# Patient Record
Sex: Male | Born: 1995 | Race: White | Hispanic: No | Marital: Single | Smoking: Current every day smoker
Health system: Southern US, Community
[De-identification: ages and names within clinical notes are randomized; demographics above are authoritative.]

## PROBLEM LIST (undated history)

## (undated) DIAGNOSIS — I10 Essential (primary) hypertension: Secondary | ICD-10-CM

---
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
## 2017-03-05 ENCOUNTER — Ambulatory Visit (INDEPENDENT_AMBULATORY_CARE_PROVIDER_SITE_OTHER): Payer: Medicaid Other | Admitting: Pediatrics

## 2017-03-05 ENCOUNTER — Encounter: Payer: Self-pay | Admitting: Pediatrics

## 2017-03-05 VITALS — BP 144/81 | HR 103 | Temp 98.0°F | Ht 72.08 in | Wt 216.2 lb

## 2017-03-05 DIAGNOSIS — I10 Essential (primary) hypertension: Secondary | ICD-10-CM

## 2017-03-05 LAB — URINALYSIS, COMPLETE
Bilirubin, UA: NEGATIVE
Glucose, UA: NEGATIVE
Ketones, UA: NEGATIVE
Leukocytes, UA: NEGATIVE
NITRITE UA: NEGATIVE
PH UA: 5.5 (ref 5.0–7.5)
Protein, UA: NEGATIVE
Specific Gravity, UA: 1.015 (ref 1.005–1.030)
UUROB: 0.2 mg/dL (ref 0.2–1.0)

## 2017-03-05 LAB — MICROSCOPIC EXAMINATION
BACTERIA UA: NONE SEEN
Epithelial Cells (non renal): NONE SEEN /hpf (ref 0–10)
RENAL EPITHEL UA: NONE SEEN /HPF

## 2017-03-05 MED ORDER — LISINOPRIL 10 MG PO TABS: 10.0000 mg | ORAL_TABLET | Freq: Every day | ORAL | 3 refills | Status: DC

## 2017-03-05 NOTE — Progress Notes (Signed)
Subjective:   Patient ID: Eric Trevino, male    DOB: Oct 16, 1995, 21 y.o.   MRN: [id number] CC: New Patient (Initial Visit) and Hypertension  HPI: Eric Trevino is a 21 y.o. male presenting for New Patient (Initial Visit) and Hypertension  Here today with his girlfriend Has a new baby at home  Says his BP has been high anytime he is in the ED, rarely seen doctor   Says he was not regularly seeing a pediatrician growing up Every time he was seen in the emergency room has an elevated blood pressure For the last few months he has had headaches about once a week Hurt all over his head Gets better when he is in a dark room Has had a couple episodes of feeling like his heart is pounding  Bothered by knee pain and back pain as well  Brought blood pressure readings from the last week with him Range 160F-093A systolic, 35T-73U diastolic in both arms Lowest reading 158/87  PMH: No history of heart problems or lung problems  His mother into his brothers have a genetic vision problem, they are legally blind   Family History  Problem Relation Age of Onset  . Vision loss Mother   . Vision loss Brother    Social History   Socioeconomic History  . Marital status: Significant Other    Spouse name: None  . Number of children: None  . Years of education: None  . Highest education level: None  Social Needs  . Financial resource strain: None  . Food insecurity - worry: None  . Food insecurity - inability: None  . Transportation needs - medical: None  . Transportation needs - non-medical: None  Occupational History  . None  Tobacco Use  . Smoking status: Current Every Day Smoker    Packs/day: 0.50    Years: 3.00    Pack years: 1.50    Types: Cigarettes  . Smokeless tobacco: Never Used  Substance and Sexual Activity  . Alcohol use: No  . Drug use: No  . Sexual activity: Yes  Other Topics Concern  . None  Social History Narrative  . None   ROS: All systems negative  other than what is in HPI  Objective:    BP (!) 144/81   Pulse (!) 103   Temp 98 F (36.7 C) (Oral)   Ht 6' 0.08" (1.831 m)   Wt 216 lb 3.2 oz (98.1 kg)   BMI 29.26 kg/m   Wt Readings from Last 3 Encounters:  03/05/17 216 lb 3.2 oz (98.1 kg) (97 %, Z= 1.83)*  08/20/16 189 lb 3.2 oz (85.8 kg) (90 %, Z= 1.26)*  08/09/11 160 lb (72.6 kg) (97 %, Z= 1.85)*   * Growth percentiles are based on CDC (Boys, 2-20 Years) data.    Gen: NAD, alert, cooperative with exam, NCAT EYES: EOMI, no conjunctival injection, or no icterus ENT:  OP without erythema LYMPH: no cervical LAD CV: NRRR, normal S1/S2, no murmur, distal pulses 2+ b/l Resp: CTABL, no wheezes, normal WOB Abd: +BS, soft, NTND. no guarding or organomegaly Ext: No edema, warm Neuro: Alert and oriented, strength equal b/l UE and LE, coordination grossly normal MSK: No crepitus bilateral knees  Assessment & Plan:  Eric Trevino was seen today for new patient (initial visit) and hypertension.  Diagnoses and all orders for this visit:  Essential hypertension Check urine, labs Patient is young to have such elevated blood pressures, BMI 29 Not regularly followed by a primary  care doctor Start lisinopril -     Urinalysis, Complete -     CMP14+EGFR -     CBC with Differential/Platelet -     lisinopril (PRINIVIL,ZESTRIL) 10 MG tablet; Take 1 tablet (10 mg total) by mouth daily.   Follow up plan: Follow-up in 2 weeks [name], MD [place]

## 2017-03-06 LAB — CBC WITH DIFFERENTIAL/PLATELET
BASOS ABS: 0 10*3/uL (ref 0.0–0.2)
BASOS: 0 %
EOS (ABSOLUTE): 0.2 10*3/uL (ref 0.0–0.4)
EOS: 3 %
HEMATOCRIT: 45.2 % (ref 37.5–51.0)
HEMOGLOBIN: 15.2 g/dL (ref 13.0–17.7)
IMMATURE GRANS (ABS): 0 10*3/uL (ref 0.0–0.1)
Immature Granulocytes: 0 %
LYMPHS ABS: 2 10*3/uL (ref 0.7–3.1)
LYMPHS: 29 %
MCH: 29.1 pg (ref 26.6–33.0)
MCHC: 33.6 g/dL (ref 31.5–35.7)
MCV: 86 fL (ref 79–97)
MONOCYTES: 12 %
Monocytes Absolute: 0.9 10*3/uL (ref 0.1–0.9)
NEUTROS ABS: 4 10*3/uL (ref 1.4–7.0)
Neutrophils: 56 %
Platelets: 257 10*3/uL (ref 150–379)
RBC: 5.23 x10E6/uL (ref 4.14–5.80)
RDW: 13.8 % (ref 12.3–15.4)
WBC: 7.1 10*3/uL (ref 3.4–10.8)

## 2017-03-06 LAB — CMP14+EGFR
ALK PHOS: 76 IU/L (ref 39–117)
ALT: 22 IU/L (ref 0–44)
AST: 22 IU/L (ref 0–40)
Albumin/Globulin Ratio: 1.6 (ref 1.2–2.2)
Albumin: 4.8 g/dL (ref 3.5–5.5)
BUN/Creatinine Ratio: 12 (ref 9–20)
BUN: 10 mg/dL (ref 6–20)
Bilirubin Total: 0.2 mg/dL (ref 0.0–1.2)
CO2: 23 mmol/L (ref 20–29)
CREATININE: 0.82 mg/dL (ref 0.76–1.27)
Calcium: 10 mg/dL (ref 8.7–10.2)
Chloride: 101 mmol/L (ref 96–106)
GFR calc Af Amer: 148 mL/min/{1.73_m2} (ref >59)
GFR calc non Af Amer: 128 mL/min/{1.73_m2} (ref >59)
Globulin, Total: 3 g/dL (ref 1.5–4.5)
Glucose: 87 mg/dL (ref 65–99)
Potassium: 4.1 mmol/L (ref 3.5–5.2)
Sodium: 140 mmol/L (ref 134–144)
TOTAL PROTEIN: 7.8 g/dL (ref 6.0–8.5)

## 2017-03-19 ENCOUNTER — Ambulatory Visit (INDEPENDENT_AMBULATORY_CARE_PROVIDER_SITE_OTHER): Payer: Medicaid Other | Admitting: Pediatrics

## 2017-03-19 ENCOUNTER — Encounter: Payer: Self-pay | Admitting: Pediatrics

## 2017-03-19 VITALS — BP 136/77 | HR 85 | Temp 97.5°F | Ht 72.09 in | Wt 216.6 lb

## 2017-03-19 DIAGNOSIS — M546 Pain in thoracic spine: Secondary | ICD-10-CM

## 2017-03-19 DIAGNOSIS — G8929 Other chronic pain: Secondary | ICD-10-CM | POA: Diagnosis not present

## 2017-03-19 DIAGNOSIS — I10 Essential (primary) hypertension: Secondary | ICD-10-CM | POA: Diagnosis not present

## 2017-03-19 DIAGNOSIS — R319 Hematuria, unspecified: Secondary | ICD-10-CM

## 2017-03-19 LAB — URINALYSIS, COMPLETE
Bilirubin, UA: NEGATIVE
Glucose, UA: NEGATIVE
Ketones, UA: NEGATIVE
NITRITE UA: NEGATIVE
PH UA: 5.5 (ref 5.0–7.5)
Protein, UA: NEGATIVE
RBC, UA: NEGATIVE
Specific Gravity, UA: 1.025 (ref 1.005–1.030)
UUROB: 0.2 mg/dL (ref 0.2–1.0)

## 2017-03-19 LAB — MICROSCOPIC EXAMINATION
BACTERIA UA: NONE SEEN
RBC MICROSCOPIC, UA: NONE SEEN /HPF (ref 0–?)
RENAL EPITHEL UA: NONE SEEN /HPF

## 2017-03-19 MED ORDER — CYCLOBENZAPRINE HCL 5 MG PO TABS: 5.0000 mg | ORAL_TABLET | Freq: Two times a day (BID) | ORAL | 1 refills | Status: DC | PRN

## 2017-03-19 NOTE — Progress Notes (Signed)
  Subjective:   Patient ID: Eric Trevino, male    DOB: 06-17-1995, 21 y.o.   MRN: [id number] CC: Follow-up (2 week) Blood pressure follow-up HPI: Eric Trevino is a 21 y.o. male presenting for Follow-up (2 week)  Started on blood pressure medicine 2 weeks ago for ongoing elevated blood pressures Has been asymptomatic No chest pain, shortness of breath, headaches, vision changes, lightheadedness Did have trace hematuria in urinalysis 2 weeks ago  Now also complaining of back pain ongoing for the last few months Used to work lifting heavy things like bricks, now mostly doing landscaping Points to mid back left side, soft tissue with where pain is the worst Says it affects him mostly at night, sometimes feels like a knife stabbing in his back Getting up walking around helps with the pain Pain does not radiate anywhere else No history of kidney stones  Relevant past medical, surgical, family and social history reviewed. Allergies and medications reviewed and updated. Social History   Tobacco Use  Smoking Status Current Every Day Smoker  . Packs/day: 0.50  . Years: 3.00  . Pack years: 1.50  . Types: Cigarettes  Smokeless Tobacco Never Used   ROS: Per HPI   Objective:    BP 136/77   Pulse 85   Temp (!) 97.5 F (36.4 C) (Oral)   Ht 6' 0.09" (1.831 m)   Wt 216 lb 9.6 oz (98.2 kg)   BMI 29.30 kg/m   Wt Readings from Last 3 Encounters:  03/19/17 216 lb 9.6 oz (98.2 kg) (97 %, Z= 1.84)*  03/05/17 216 lb 3.2 oz (98.1 kg) (97 %, Z= 1.83)*  08/20/16 189 lb 3.2 oz (85.8 kg) (90 %, Z= 1.26)*   * Growth percentiles are based on CDC (Boys, 2-20 Years) data.    Blood pressure percentiles are 90 % systolic and 71 % diastolic based on the August 2017 AAP Clinical Practice Guideline. This reading is in the Stage 1 hypertension range (BP >= 130/80).  Gen: NAD, alert, cooperative with exam, NCAT EYES: EOMI, no conjunctival injection, or no icterus ENT:  OP without erythema LYMPH:  no cervical LAD CV: NRRR, normal S1/S2, no murmur, distal pulses 2+ b/l Resp: CTABL, no wheezes, normal WOB Abd: +BS, soft, NTND. no guarding or organomegaly, no CVA tenderness Ext: No edema, warm Neuro: Alert and oriented, strength equal b/l UE and LE, coordination grossly normal MSK: ttp L mid back paraspinal muscles Full ROM flexion, twisting back No crepitus b/l knees  Assessment & Plan:  [place] was seen today for follow-up HTN.  Diagnoses and all orders for this visit:  Essential hypertension Slightly elevated today Patient to check at home, continue current medicine We will recheck labs  Chronic left-sided thoracic back pain Bothers mostly at night after working ttp over paraspinal muscles Will do trial of gentle back exercises/stretching Trace hematuria as below, repeat -     cyclobenzaprine (FLEXERIL) 5 MG tablet; Take 1 tablet (5 mg total) by mouth 2 (two) times daily as needed for muscle spasms. -     BMP8+EGFR -     Urinalysis, Complete  Hematuria Repeat UA, did have trace hematuria  Follow up plan: Return in about 3 months (around 06/17/2017). [name], MD [place]

## 2017-03-19 NOTE — Patient Instructions (Signed)
Back Exercises If you have pain in your back, do these exercises 2-3 times each day or as told by your doctor. When the pain goes away, do the exercises once each day, but repeat the steps more times for each exercise (do more repetitions). If you do not have pain in your back, do these exercises once each day or as told by your doctor. Exercises Single Knee to Chest  Do these steps 3-5 times in a row for each leg: 1. Lie on your back on a firm bed or the floor with your legs stretched out. 2. Bring one knee to your chest. 3. Hold your knee to your chest by grabbing your knee or thigh. 4. Pull on your knee until you feel a gentle stretch in your lower back. 5. Keep doing the stretch for 10-30 seconds. 6. Slowly let go of your leg and straighten it.  Pelvic Tilt  Do these steps 5-10 times in a row: 1. Lie on your back on a firm bed or the floor with your legs stretched out. 2. Bend your knees so they point up to the ceiling. Your feet should be flat on the floor. 3. Tighten your lower belly (abdomen) muscles to press your lower back against the floor. This will make your tailbone point up to the ceiling instead of pointing down to your feet or the floor. 4. Stay in this position for 5-10 seconds while you gently tighten your muscles and breathe evenly.  Cat-Cow  Do these steps until your lower back bends more easily: 1. Get on your hands and knees on a firm surface. Keep your hands under your shoulders, and keep your knees under your hips. You may put padding under your knees. 2. Let your head hang down, and make your tailbone point down to the floor so your lower back is round like the back of a cat. 3. Stay in this position for 5 seconds. 4. Slowly lift your head and make your tailbone point up to the ceiling so your back hangs low (sags) like the back of a cow. 5. Stay in this position for 5 seconds.  Press-Ups  Do these steps 5-10 times in a row: 1. Lie on your belly (face-down)  on the floor. 2. Place your hands near your head, about shoulder-width apart. 3. While you keep your back relaxed and keep your hips on the floor, slowly straighten your arms to raise the top half of your body and lift your shoulders. Do not use your back muscles. To make yourself more comfortable, you may change where you place your hands. 4. Stay in this position for 5 seconds. 5. Slowly return to lying flat on the floor.  Bridges  Do these steps 10 times in a row: 1. Lie on your back on a firm surface. 2. Bend your knees so they point up to the ceiling. Your feet should be flat on the floor. 3. Tighten your butt muscles and lift your butt off of the floor until your waist is almost as high as your knees. If you do not feel the muscles working in your butt and the back of your thighs, slide your feet 1-2 inches farther away from your butt. 4. Stay in this position for 3-5 seconds. 5. Slowly lower your butt to the floor, and let your butt muscles relax.  If this exercise is too easy, try doing it with your arms crossed over your chest. Back Lifts Do these steps 5-10 times in a   row: 1. Lie on your belly (face-down) with your arms at your sides, and rest your forehead on the floor. 2. Tighten the muscles in your legs and your butt. 3. Slowly lift your chest off of the floor while you keep your hips on the floor. Keep the back of your head in line with the curve in your back. Look at the floor while you do this. 4. Stay in this position for 3-5 seconds. 5. Slowly lower your chest and your face to the floor.  Contact a doctor if:  Your back pain gets a lot worse when you do an exercise.  Your back pain does not lessen 2 hours after you exercise. If you have any of these problems, stop doing the exercises. Do not do them again unless your doctor says it is okay. Get help right away if:  You have sudden, very bad back pain. If this happens, stop doing the exercises. Do not do them again  unless your doctor says it is okay. This information is not intended to replace advice given to you by your health care provider. Make sure you discuss any questions you have with your health care provider. Document Released: 04/22/2010 Document Revised: 08/26/2015 Document Reviewed: 05/14/2014 Elsevier Interactive Patient Education  2018 [employer] Inc[employer]   

## 2017-03-20 LAB — BMP8+EGFR
BUN / CREAT RATIO: 10 (ref 9–20)
BUN: 9 mg/dL (ref 6–20)
CALCIUM: 9.7 mg/dL (ref 8.7–10.2)
CO2: 27 mmol/L (ref 20–29)
Chloride: 100 mmol/L (ref 96–106)
Creatinine, Ser: 0.88 mg/dL (ref 0.76–1.27)
GFR, EST AFRICAN AMERICAN: 144 mL/min/{1.73_m2} (ref >59)
GFR, EST NON AFRICAN AMERICAN: 125 mL/min/{1.73_m2} (ref >59)
Glucose: 81 mg/dL (ref 65–99)
POTASSIUM: 4.3 mmol/L (ref 3.5–5.2)
Sodium: 139 mmol/L (ref 134–144)

## 2017-04-11 ENCOUNTER — Ambulatory Visit (INDEPENDENT_AMBULATORY_CARE_PROVIDER_SITE_OTHER): Payer: Medicaid Other

## 2017-04-11 ENCOUNTER — Ambulatory Visit (INDEPENDENT_AMBULATORY_CARE_PROVIDER_SITE_OTHER): Payer: Medicaid Other | Admitting: Pediatrics

## 2017-04-11 ENCOUNTER — Encounter: Payer: Self-pay | Admitting: Pediatrics

## 2017-04-11 VITALS — BP 129/77 | HR 87 | Temp 97.6°F | Ht 72.1 in | Wt 213.0 lb

## 2017-04-11 DIAGNOSIS — M25562 Pain in left knee: Secondary | ICD-10-CM | POA: Diagnosis not present

## 2017-04-11 DIAGNOSIS — M546 Pain in thoracic spine: Secondary | ICD-10-CM | POA: Diagnosis not present

## 2017-04-11 DIAGNOSIS — M25572 Pain in left ankle and joints of left foot: Secondary | ICD-10-CM

## 2017-04-11 DIAGNOSIS — M25561 Pain in right knee: Secondary | ICD-10-CM

## 2017-04-11 DIAGNOSIS — G8929 Other chronic pain: Secondary | ICD-10-CM | POA: Diagnosis not present

## 2017-04-11 NOTE — Patient Instructions (Signed)
Take 600 mg of ibuprofen every 8 hours as needed for pain  Okay to take 10 mg of Flexeril at night to help with back spasms   Back Exercises If you have pain in your back, do these exercises 2-3 times each day or as told by your doctor. When the pain goes away, do the exercises once each day, but repeat the steps more times for each exercise (do more repetitions). If you do not have pain in your back, do these exercises once each day or as told by your doctor. Exercises Single Knee to Chest  Do these steps 3-5 times in a row for each leg: 1. Lie on your back on a firm bed or the floor with your legs stretched out. 2. Bring one knee to your chest. 3. Hold your knee to your chest by grabbing your knee or thigh. 4. Pull on your knee until you feel a gentle stretch in your lower back. 5. Keep doing the stretch for 10-30 seconds. 6. Slowly let go of your leg and straighten it.  Pelvic Tilt  Do these steps 5-10 times in a row: 1. Lie on your back on a firm bed or the floor with your legs stretched out. 2. Bend your knees so they point up to the ceiling. Your feet should be flat on the floor. 3. Tighten your lower belly (abdomen) muscles to press your lower back against the floor. This will make your tailbone point up to the ceiling instead of pointing down to your feet or the floor. 4. Stay in this position for 5-10 seconds while you gently tighten your muscles and breathe evenly.  Cat-Cow  Do these steps until your lower back bends more easily: 1. Get on your hands and knees on a firm surface. Keep your hands under your shoulders, and keep your knees under your hips. You may put padding under your knees. 2. Let your head hang down, and make your tailbone point down to the floor so your lower back is round like the back of a cat. 3. Stay in this position for 5 seconds. 4. Slowly lift your head and make your tailbone point up to the ceiling so your back hangs low (sags) like the back of a  cow. 5. Stay in this position for 5 seconds.  Press-Ups  Do these steps 5-10 times in a row: 1. Lie on your belly (face-down) on the floor. 2. Place your hands near your head, about shoulder-width apart. 3. While you keep your back relaxed and keep your hips on the floor, slowly straighten your arms to raise the top half of your body and lift your shoulders. Do not use your back muscles. To make yourself more comfortable, you may change where you place your hands. 4. Stay in this position for 5 seconds. 5. Slowly return to lying flat on the floor.  Bridges  Do these steps 10 times in a row: 1. Lie on your back on a firm surface. 2. Bend your knees so they point up to the ceiling. Your feet should be flat on the floor. 3. Tighten your butt muscles and lift your butt off of the floor until your waist is almost as high as your knees. If you do not feel the muscles working in your butt and the back of your thighs, slide your feet 1-2 inches farther away from your butt. 4. Stay in this position for 3-5 seconds. 5. Slowly lower your butt to the floor, and let your  butt muscles relax.  If this exercise is too easy, try doing it with your arms crossed over your chest. Back Lifts Do these steps 5-10 times in a row: 1. Lie on your belly (face-down) with your arms at your sides, and rest your forehead on the floor. 2. Tighten the muscles in your legs and your butt. 3. Slowly lift your chest off of the floor while you keep your hips on the floor. Keep the back of your head in line with the curve in your back. Look at the floor while you do this. 4. Stay in this position for 3-5 seconds. 5. Slowly lower your chest and your face to the floor.  Contact a doctor if:  Your back pain gets a lot worse when you do an exercise.  Your back pain does not lessen 2 hours after you exercise. If you have any of these problems, stop doing the exercises. Do not do them again unless your doctor says it is  okay. Get help right away if:  You have sudden, very bad back pain. If this happens, stop doing the exercises. Do not do them again unless your doctor says it is okay. This information is not intended to replace advice given to you by your health care provider. Make sure you discuss any questions you have with your health care provider. Document Released: 04/22/2010 Document Revised: 08/26/2015 Document Reviewed: 05/14/2014 Elsevier Interactive Patient Education  [employer]2018 [employer] Inc[employer]

## 2017-04-11 NOTE — Progress Notes (Signed)
  Subjective:   Patient ID: Eric Trevino, male    DOB: 04-13-1995, 22 y.o.   MRN: [id number] CC: Back Pain (Mid, left); Leg Pain (Bilateral); and Foot Pain (Bilateral)  HPI: Eric Trevino is a 22 y.o. male presenting for Back Pain (Mid, left); Leg Pain (Bilateral); and Foot Pain (Bilateral)  Works in Glass blower/designerlandscaping Broke his left foot several years ago Since then has had left-sided foot pain when he is on his feet for long periods of time Wearing old work boots with minimal support  Pain is been ongoing for fracture, worse at the end of the day  He has had back pain for about the last year Points to mid left side soft tissue from where the pain is the worst Lifting heavy things makes pain worse  He says at night both of his knees bother him, he feels shooting pains from his knees up into his legs at times  Was started on Flexeril last visit for his back pain, 5 mg.  Has not been helping. Not taking NSAIDs  Relevant past medical, surgical, family and social history reviewed. Allergies and medications reviewed and updated. Social History   Tobacco Use  Smoking Status Current Every Day Smoker  . Packs/day: 0.50  . Years: 3.00  . Pack years: 1.50  . Types: Cigarettes  Smokeless Tobacco Never Used   ROS: Per HPI   Objective:    BP 129/77   Pulse 87   Temp 97.6 F (36.4 C) (Oral)   Ht 6' 0.1" (1.831 m)   Wt 213 lb (96.6 kg)   BMI 28.81 kg/m   Wt Readings from Last 3 Encounters:  04/11/17 213 lb (96.6 kg) (96 %, Z= 1.76)*  03/19/17 216 lb 9.6 oz (98.2 kg) (97 %, Z= 1.84)*  03/05/17 216 lb 3.2 oz (98.1 kg) (97 %, Z= 1.83)*   * Growth percentiles are based on CDC (Boys, 2-20 Years) data.    Gen: NAD, alert, cooperative with exam, NCAT EYES: EOMI, no conjunctival injection, or no icterus CV:  distal pulses 2+ b/l Resp normal WOB Ext: No edema, warm Neuro: Alert and oriented,  coordination grossly normal MSK: No point tenderness over spine, tender to palpation  left-sided paraspinal muscles mid back Knees normal to inspection bilaterally, no effusion, no redness Minimal medial joint line bilaterally with palpation Normal range of motion bilateral knees, 180 degrees to at least 45 degrees bilaterally No crepitus freely moving patella, no pain Left foot with bony prominence below lateral malleolus No redness, swelling Mildly tender to palpation Nl ROM b/l ankles   Assessment & Plan:  Eric Trevino was seen today for back pain, leg pain and foot pain.  Diagnoses and all orders for this visit:  Chronic pain of left ankle History of fracture now with bony prominence We will get x-ray -     Ambulatory referral to Sports Medicine -     DG Foot Complete Left; Future  Chronic left-sided thoracic back pain Flexeril 10 mg nightly as needed NSAIDs, rest, gentle back exercises given -     Ambulatory referral to Sports Medicine  Chronic pain of both knees -     Ambulatory referral to Sports Medicine   Follow up plan: 3 months, sooner if needed Rex Krasarol Vincent, MD [facility]

## 2017-04-18 ENCOUNTER — Ambulatory Visit
Admission: RE | Admit: 2017-04-18 | Discharge: 2017-04-18 | Disposition: A | Discharge status: Home / Self Care | Payer: Medicaid Other | Source: Ambulatory Visit | Attending: Family Medicine | Admitting: Family Medicine

## 2017-04-18 ENCOUNTER — Ambulatory Visit (INDEPENDENT_AMBULATORY_CARE_PROVIDER_SITE_OTHER): Payer: Medicaid Other | Admitting: Family Medicine

## 2017-04-18 ENCOUNTER — Encounter: Payer: Self-pay | Admitting: Family Medicine

## 2017-04-18 ENCOUNTER — Other Ambulatory Visit: Payer: Self-pay | Admitting: Family Medicine

## 2017-04-18 ENCOUNTER — Telehealth: Payer: Self-pay | Admitting: Family Medicine

## 2017-04-18 VITALS — BP 141/71 | Ht 72.0 in | Wt 216.0 lb

## 2017-04-18 DIAGNOSIS — M25561 Pain in right knee: Secondary | ICD-10-CM | POA: Diagnosis not present

## 2017-04-18 DIAGNOSIS — G8929 Other chronic pain: Secondary | ICD-10-CM | POA: Diagnosis not present

## 2017-04-18 DIAGNOSIS — M546 Pain in thoracic spine: Secondary | ICD-10-CM

## 2017-04-18 DIAGNOSIS — M25562 Pain in left knee: Secondary | ICD-10-CM

## 2017-04-18 MED ORDER — MELOXICAM 15 MG PO TABS: 15.0000 mg | ORAL_TABLET | Freq: Every day | ORAL | 1 refills | Status: DC

## 2017-04-18 NOTE — Progress Notes (Addendum)
Chief complaint: Thoracic back pain 18 months, left knee pain 6 months, right knee pain 3 months  History of present illness: Eric Trevino is a 22 year old male who presents to the sports medicine office today with a few different issues today. He was kindly referred here by his primary physician for further evaluation. First off, he reports of having midthoracic back pain. He points along the paraspinal thoracic musculature as to where he feels the pain. He reports that symptoms have been present for approximately 18 months. He does not report of any specific inciting incident, trauma, or injury to explain the pain. He reports pain does radiate up and down the paraspinal musculature of the back, does not radiate down the arms or the legs. He describes the pain as a throbbing and aching pain. He reports back flexion and back extension causes pain. He does have a 51-month-old infant at home, reports that when he picks and raises him up he feels the pain in his back. He does not report of any previous back injury. He does not recall having history of scoliosis or unusual curvature of the spine. He does not report of any bowel or bladder incontinence, does not report of any saddle anesthesia. He has chatted with his primary physician about this previously. 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 He notices that his back pain is more bothersome at the end of the day. He was started on Flexeril, does not report of any interval improvement in symptoms with this. He was just recently diagnosed with hypertension was started on lisinopril 10 mg daily.  He also reports today of having bilateral knee pain. He reports that his left knee started bothering him about 6 months ago, with his right knee starting to bother him about 3 months ago. He does not report of any specific inciting incident, trauma, or injury to explain these pains. He reports both sides are  equally as bothersome. He reports pain being on the anterior knees bilaterally, as well as feeling pain behind the knee. He reports of feeling popping, but no painful popping. He does not report of any locking, catching, or symptoms of giving way. He does not report of any warmth, erythema, or ecchymosis, he does notice slight swelling of both of his knees at the end the day after he takes a shower. Has tried intermittent over-the-counter ibuprofen 600 mg.   Review of systems:  As stated above  His past medical history, surgical history, family history, and social history obtained and reviewed. He does have recent diagnosis of hypertension, started on lisinopril 10 mg daily, is not report of any surgeries/procedures, does report of current tobacco use, one half pack per day for the last 3 years, family history notable for vision loss in mother and brother, no known drug allergies  Physical exam: Vital signs are reviewed and are documented in the chart Gen.: Alert, oriented, appears stated age, in no apparent distress HEENT: Moist oral mucosa Respiratory: Normal respirations, able to speak in full sentences Cardiac: Regular rate, distal pulses 2+ Integumentary: No rashes on visible skin:  Neurologic: He is neurovascularly intact distally, he is slightly weak with hip abductor and hamstring strength on both sides, otherwise arm strength and leg strength intact and symmetric bilaterally, sensation is intact in bilateral arms and legs Psych: Normal affect, mood is described as good Musculoskeletal: Inspection of thoracic spine reveals no obvious deformity or muscle atrophy, no warmth,  erythema, ecchymosis, or effusion, he is slightly tender to palpation over multiple areas of the paraspinal thoracic spine, no tenderness over the midline of the thoracic spine, no tenderness over the cervical spine, paracervical spinal processes or lumbar spine and paralumbar spinal processes, he does report a tightness  and pain with back flexion and back extension, no pain elicited with neck flexion or neck extension, straight leg negative bilaterally, stork test negative bilaterally, FABER and FADIR negative bilaterally, logroll negative bilaterally, no scapular winging or noticeable curvature of the spine, inspection of his right knee reveals no obvious deformity or muscle atrophy, no warmth, erythema, ecchymosis, or effusion, he does have slight tenderness to palpation along the parapatellar region, no tenderness over the quadriceps tendon, patellar tendon, medial joint line, or lateral joint line, no signs of ligamentous instability as Lachman, anterior drawer, valgus, varus stress testing negative, McMurray negative, his knee flexion and extension strength is intact, patellar apprehension test positive;  inspection of his left knee reveals no obvious deformity or muscle atrophy, no warmth, erythema, ecchymosis, or effusion, he does have slight tenderness to palpation along the parapatellar region as well, no tenderness over the quadriceps tendon, patellar tendon, medial joint line, or lateral joint line, no signs of ligamentous instability as Lachman, anterior drawer, valgus, varus stress testing negative, McMurray negative, his knee flexion and extension strength is intact on that side as well, patellar apprehension test positive, no gait abnormality, does have full range of motion of both of his knees, no crepitation felt along arc of range of motion   Assessment and plan: 1. Mid thoracic paraspinal muscle strain 2. Left knee patellofemoral pain syndrome 3. Right knee patellofemoral pain syndrome 4. Recent diagnosis of hypertension, on lisinopril 5. Current tobacco use  Plan: In regards to his back, his symptoms seem be all related to midthoracic paraspinal muscle strain. He does not have any type of neurologic deficits to make me concerned for any type of radiculopathy or nerve impingement. Given the chronicity  of his back pain, do feel that it is reasonable to obtain x-ray of his thoracic spine today. We'll have this ordered. Will order for 2 films of his thoracic spine to include AP and lateral. Given that his knee pain has also been present for a few months, will also order for x-rays of both of his knees. Will order for 4 views of each knee, to include AP, lateral, sunrise, and Rosenberg. We'll call him after x-ray results to let him know of the results. I do feel he needs to be on an anti-inflammatory medication in the short-term to calm things down in his back. I discussed to use meloxicam 15 mg daily for the next 2 weeks and see how he does after that. I discussed with his recent diagnosis of hypertension and being on lisinopril that this is not the most ideal situation, but I do feel this is probably the best thing for him to get his back calmed down. I discussed short term use of this will be ok. I discussed with him to continue with the Flexeril 10 mg nightly. In addition, did give him some physical therapy exercises to do at home for his back and his knee. Do feel that the anti-inflammatory medication will help out with his knee pain as well. I discussed to have him touch base with his primary physician to repeat a BMP in one month or so to make sure his kidney function levels are ok. He is understanding and agreeable to  this. Will have him follow up in 4 weeks here as well to see how he is doing or sooner as needed.  Haynes Kerns, M.D. Primary Care Sports Medicine Fellow [facility] [employer]

## 2017-04-18 NOTE — Telephone Encounter (Signed)
Called Eric Trevino at 1640 today and discussed his XR results of his thoracic spine as well as both of his knees. Fortunately, everything looks good in all of these areas. In regards to his back, discussed on my personal review and radiology review, no evidence of any acute bony abnormality, no evidence of loss of vertebral bone height, and normal intervertebral spacing. No evidence of any spondylosis or spondylolithesis. In regards to his knees, he has well preserved joint space, no evidence of patellar shifting on these radiographs. I discussed no changes in the management. I discussed continued back exercises, medication, heat, and avoidance of aggravating factors. Same goes for his knee, with use of ice instead of heat. Will see him back in 4 weeks for follow up or sooner as needed. All questions answered to his satisfaction.  Haynes Kernshristopher Mahayla Haddaway, MD Primary Care Sports Medicine Fellow [facility]

## 2017-05-04 ENCOUNTER — Other Ambulatory Visit: Payer: Self-pay | Admitting: Pediatrics

## 2017-05-04 DIAGNOSIS — G8929 Other chronic pain: Secondary | ICD-10-CM

## 2017-05-04 DIAGNOSIS — M546 Pain in thoracic spine: Principal | ICD-10-CM

## 2017-05-16 ENCOUNTER — Ambulatory Visit: Payer: Medicaid Other | Admitting: Family Medicine

## 2017-06-18 ENCOUNTER — Encounter: Payer: Self-pay | Admitting: Pediatrics

## 2017-06-18 ENCOUNTER — Ambulatory Visit (INDEPENDENT_AMBULATORY_CARE_PROVIDER_SITE_OTHER): Payer: Medicaid Other | Admitting: Pediatrics

## 2017-06-18 VITALS — BP 137/82 | HR 100 | Temp 97.0°F | Ht 72.02 in | Wt 226.2 lb

## 2017-06-18 DIAGNOSIS — I10 Essential (primary) hypertension: Secondary | ICD-10-CM | POA: Diagnosis not present

## 2017-06-18 DIAGNOSIS — Z720 Tobacco use: Secondary | ICD-10-CM

## 2017-06-18 DIAGNOSIS — Z6830 Body mass index (BMI) 30.0-30.9, adult: Secondary | ICD-10-CM | POA: Diagnosis not present

## 2017-06-18 DIAGNOSIS — M791 Myalgia, unspecified site: Secondary | ICD-10-CM | POA: Diagnosis not present

## 2017-06-18 MED ORDER — LISINOPRIL 10 MG PO TABS: 10.0000 mg | ORAL_TABLET | Freq: Every day | ORAL | 1 refills | Status: DC

## 2017-06-18 NOTE — Progress Notes (Signed)
  Subjective:   Patient ID: Eric Trevino, male    DOB: 1996/02/19, 22 y.o.   MRN: [id number] CC: Follow-up (3 month, Blood Pressure) and Hand Pain (Bilateral)  HPI: Eric Trevino is a 22 y.o. male presenting for Follow-up (3 month, Blood Pressure) and Hand Pain (Bilateral)  Here today with his father-in-law.  Eric Trevino for 2 weeks, restarted 3-4 days ago.  He noticed when he was off the medicine his blood pressure would go up to the 160 systolic.  He also had more headaches.  He has been feeling more back to his normal self the last couple days since restarting the medicine.  Tobacco use: Smoking every day.  Says quitting will be hard.  Not smoking in the car.  Multiple other family members smoking at home as well.  For last couple days has had pain in the muscle  /webbing between thumb and second finger of both hands.  No other swelling, no other redness.  He does not think that he has been doing anything different than usual with his hands.  He works with his hands regularly fixing engines.  No fevers.  Otherwise has been feeling well.  Appetite has been fine.  Relevant past medical, surgical, family and social history reviewed. Allergies and medications reviewed and updated. Social History   Tobacco Use  Smoking Status Current Every Day Smoker  . Packs/day: 0.50  . Years: 3.00  . Pack years: 1.50  . Types: Cigarettes  Smokeless Tobacco Never Used   ROS: Per HPI   Objective:    BP 137/82   Pulse 100   Temp (!) 97 F (36.1 C) (Oral)   Ht 6' 0.02" (1.829 m)   Wt 226 lb 3.2 oz (102.6 kg)   BMI 30.66 kg/m   Wt Readings from Last 3 Encounters:  06/18/17 226 lb 3.2 oz (102.6 kg) (98 %, Z= 2.01)*  04/18/17 216 lb (98 kg) (97 %, Z= 1.82)*  04/11/17 213 lb (96.6 kg) (96 %, Z= 1.76)*   * Growth percentiles are based on CDC (Boys, 2-20 Years) data.    Gen: NAD, alert, cooperative with exam, NCAT EYES: EOMI, no conjunctival injection, or no icterus CV: NRRR, normal  S1/S2, no murmur, distal pulses 2+ b/l Resp: CTABL, no wheezes, normal WOB Abd: +BS, soft, NTND. no guarding or organomegaly Ext: No edema, warm Neuro: Alert and oriented, strength equal b/l UE and LE, coordination grossly normal MSK: Tender to palpation with symmetric swelling over abductor pollicis brevis, flexor pollicis bilateral hands.  Some soreness with thumb range of motion bilaterally.  No joint swelling.  Assessment & Plan:  [name] was seen today for follow-up and hand pain.  Diagnoses and all orders for this visit:  Muscle soreness Uses hands regularly for work.  Has not happened before.  Recommended rest, ice, ibuprofen.  If any worsening let me know.  Essential hypertension Slightly elevated today.  Recently stopped taking for 2 weeks.  Discussed ways to remember to take his medicine.  We will continue to check at home with blood pressure cuff.  Let me know if regularly elevated. -     lisinopril (PRINIVIL,ZESTRIL) 10 MG tablet; Take 1 tablet (10 mg total) by mouth daily.  Tobacco use Continue to encourage cessation.  BMI 30.0-30.9,adult Encouraged lifestyle changes, portion control.  Regular physical activity.  Follow up plan: Return in about 6 months (around 12/19/2017). Rex Krasarol Kellyjo Edgren, MD [facility]

## 2017-06-21 ENCOUNTER — Other Ambulatory Visit: Payer: Self-pay | Admitting: Pediatrics

## 2017-06-21 ENCOUNTER — Other Ambulatory Visit: Payer: Self-pay | Admitting: Family Medicine

## 2017-06-21 DIAGNOSIS — G8929 Other chronic pain: Secondary | ICD-10-CM

## 2017-06-21 DIAGNOSIS — M546 Pain in thoracic spine: Principal | ICD-10-CM

## 2017-12-31 ENCOUNTER — Ambulatory Visit: Payer: Medicaid Other | Admitting: Pediatrics

## 2018-01-02 ENCOUNTER — Ambulatory Visit: Payer: Medicaid Other | Admitting: Pediatrics

## 2018-01-03 ENCOUNTER — Encounter: Payer: Self-pay | Admitting: Pediatrics

## 2018-05-13 ENCOUNTER — Other Ambulatory Visit: Payer: Self-pay | Admitting: Pediatrics

## 2018-05-13 DIAGNOSIS — I10 Essential (primary) hypertension: Secondary | ICD-10-CM

## 2018-06-13 IMAGING — DX DG FOOT COMPLETE 3+V*R*
3 series · 3 of 3 positions shown · non-contrast
Comparison: None.

CLINICAL DATA: Right foot pain without trauma.

EXAM:
RIGHT FOOT COMPLETE - 3+ VIEW

[foot ap]
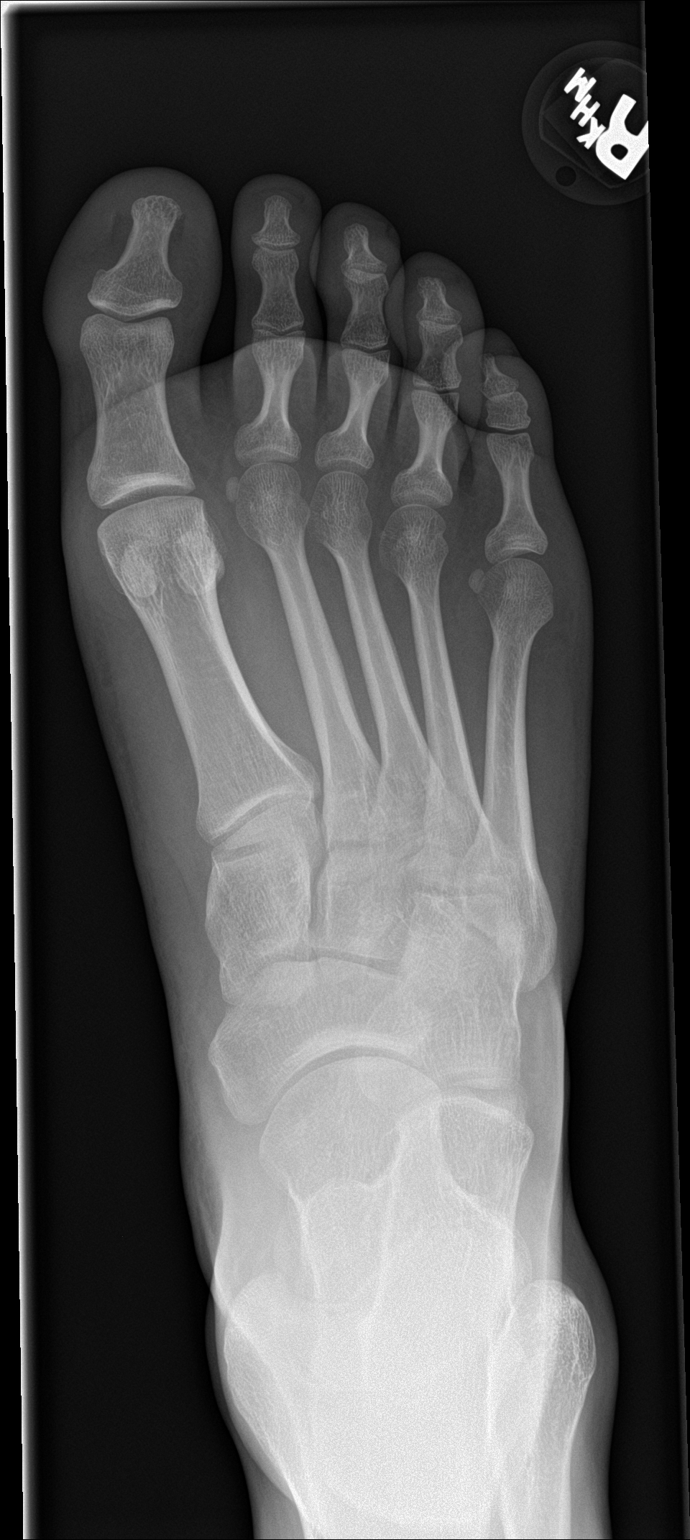

[foot obl]
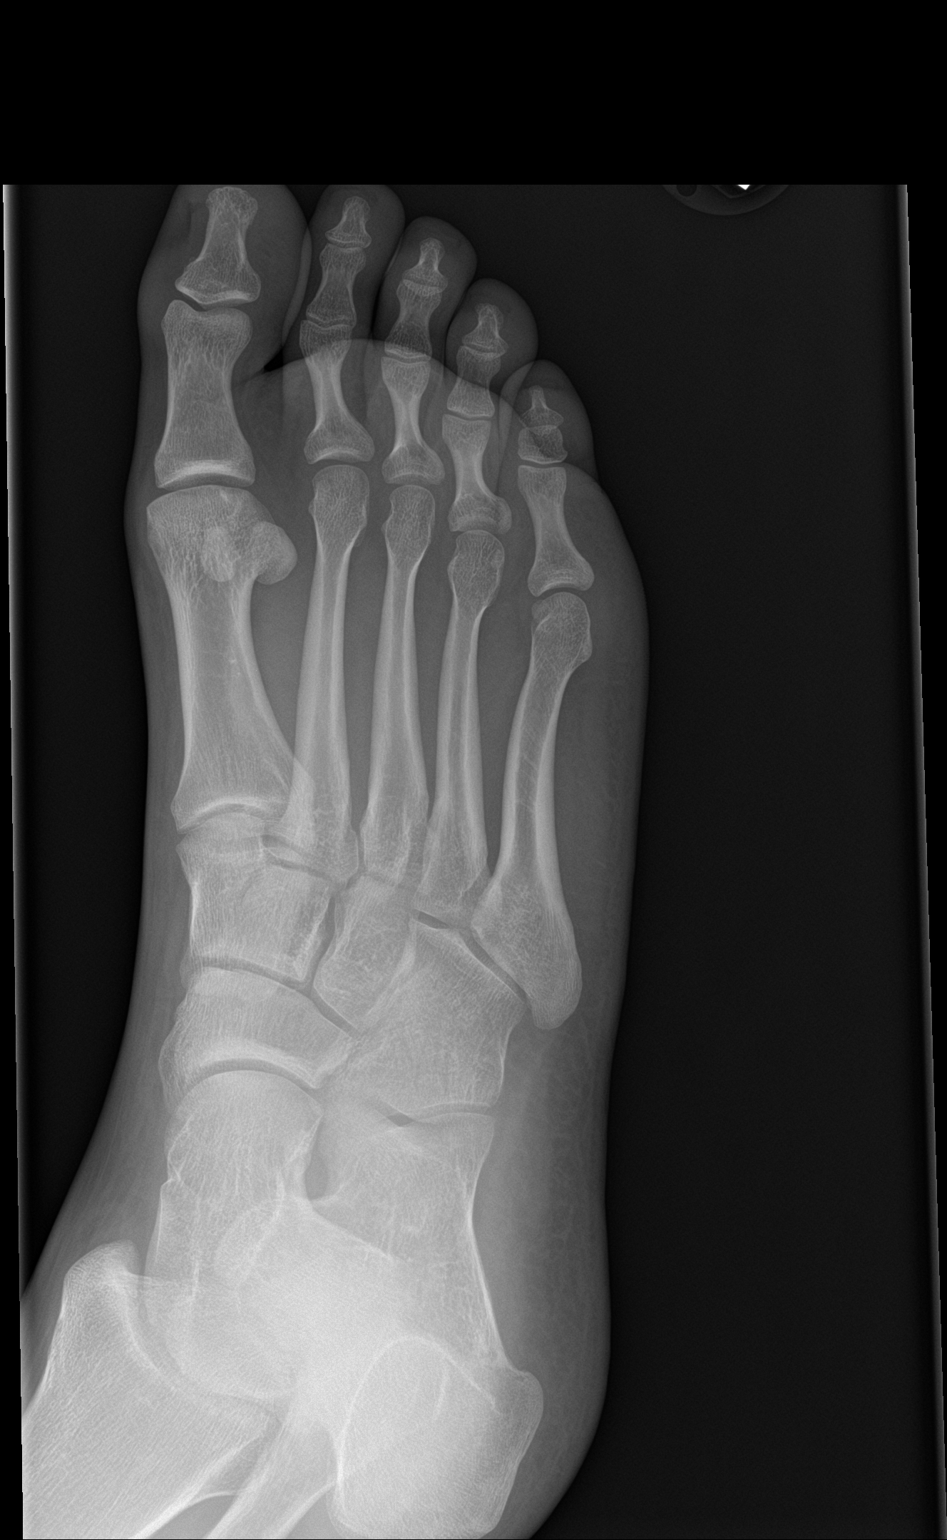

[foot lat]
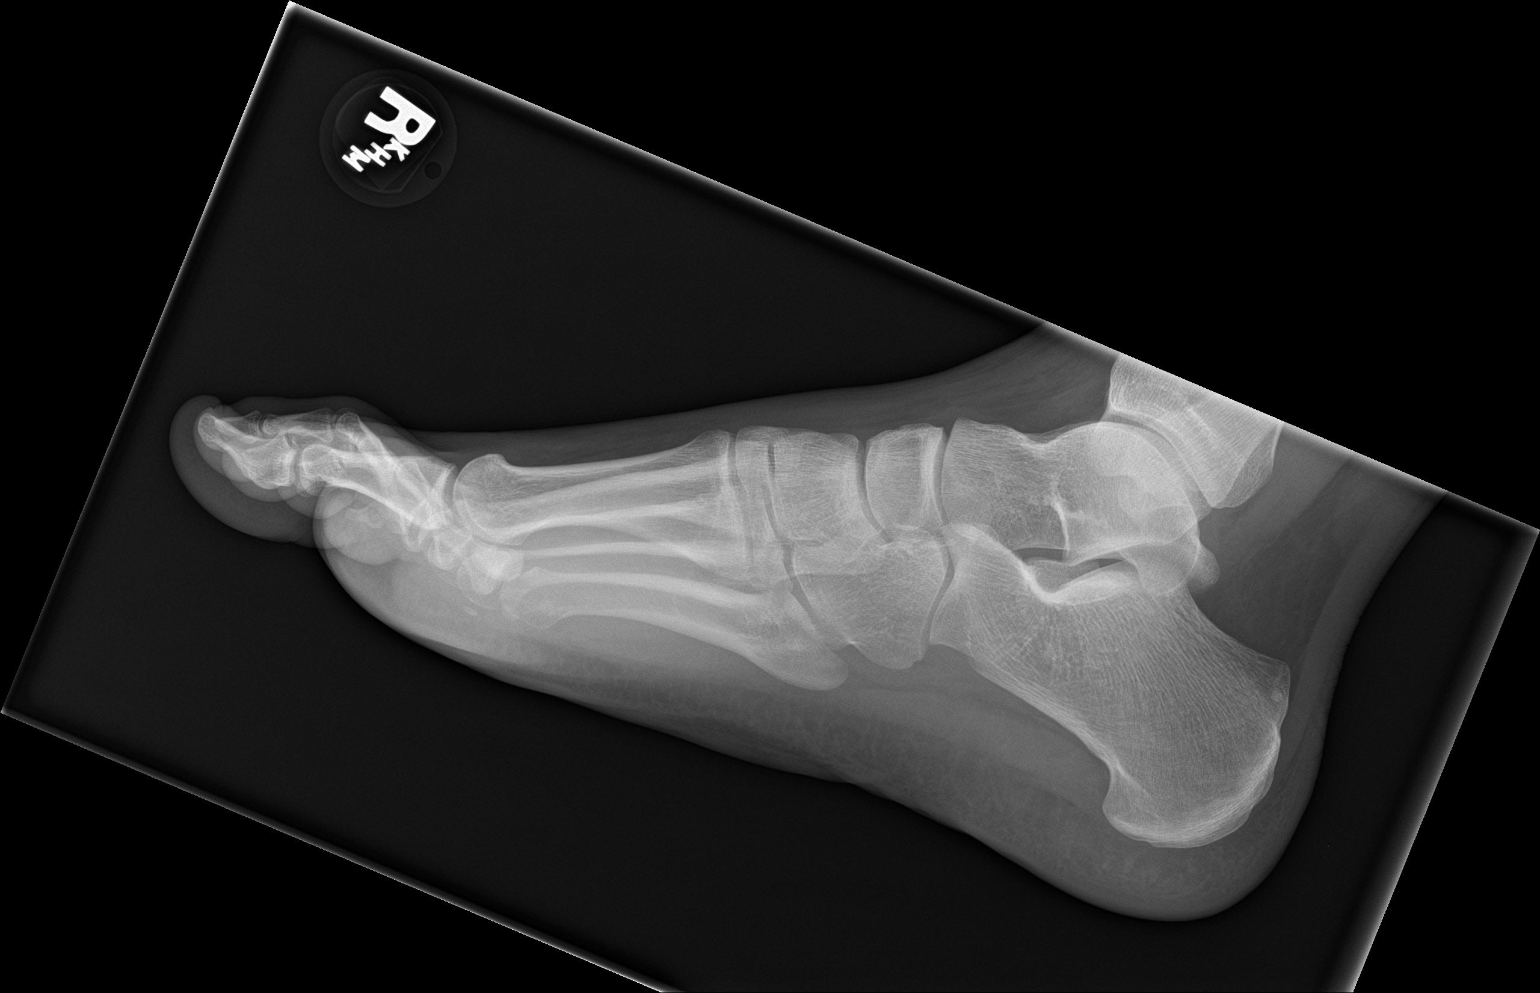

[3 of 3 positions shown; findings below may reference images not displayed]

FINDINGS: There is an oval density along the plantar aspect of the foot at the
level of the metatarsal heads only seen on the lateral view, not
present in 9387. No fracture or dislocation. No bony erosion.
IMPRESSION: There is an oval density along the plantar aspect of the foot at the
level of the metatarsal heads only seen on the lateral view, not
present in 9387. This could represent a foreign body or soft tissue
calcification. Recommend clinical correlation.

## 2018-08-13 ENCOUNTER — Encounter (HOSPITAL_COMMUNITY): Payer: Self-pay | Admitting: *Deleted

## 2018-08-13 ENCOUNTER — Other Ambulatory Visit: Payer: Self-pay

## 2018-08-13 ENCOUNTER — Emergency Department (HOSPITAL_COMMUNITY): Payer: HRSA Program

## 2018-08-13 ENCOUNTER — Emergency Department (HOSPITAL_COMMUNITY)
Admission: EM | Admit: 2018-08-13 | Discharge: 2018-08-13 | Disposition: A | Discharge status: Home / Self Care | Payer: HRSA Program | Attending: Emergency Medicine | Admitting: Emergency Medicine

## 2018-08-13 DIAGNOSIS — Z79899 Other long term (current) drug therapy: Secondary | ICD-10-CM | POA: Insufficient documentation

## 2018-08-13 DIAGNOSIS — Z20828 Contact with and (suspected) exposure to other viral communicable diseases: Secondary | ICD-10-CM | POA: Insufficient documentation

## 2018-08-13 DIAGNOSIS — I10 Essential (primary) hypertension: Secondary | ICD-10-CM | POA: Insufficient documentation

## 2018-08-13 DIAGNOSIS — F17210 Nicotine dependence, cigarettes, uncomplicated: Secondary | ICD-10-CM | POA: Insufficient documentation

## 2018-08-13 DIAGNOSIS — R05 Cough: Secondary | ICD-10-CM

## 2018-08-13 DIAGNOSIS — R059 Cough, unspecified: Secondary | ICD-10-CM

## 2018-08-13 HISTORY — DX: Essential (primary) hypertension: I10

## 2018-08-13 MED ORDER — BENZONATATE 200 MG PO CAPS: 200.0000 mg | ORAL_CAPSULE | Freq: Three times a day (TID) | ORAL | 0 refills | Status: DC | PRN

## 2018-08-13 MED ORDER — IBUPROFEN 800 MG PO TABS
800.0000 mg | ORAL_TABLET | Freq: Once | ORAL | Status: AC
  Administered 2018-08-13: 800 mg via ORAL
  Filled 2018-08-13: qty 1

## 2018-08-13 MED ORDER — IBUPROFEN 600 MG PO TABS: 600.0000 mg | ORAL_TABLET | Freq: Four times a day (QID) | ORAL | 0 refills | Status: DC | PRN

## 2018-08-13 MED ORDER — ALBUTEROL SULFATE HFA 108 (90 BASE) MCG/ACT IN AERS: 1.0000 | INHALATION_SPRAY | Freq: Four times a day (QID) | RESPIRATORY_TRACT | 0 refills | Status: DC | PRN

## 2018-08-13 NOTE — Discharge Instructions (Addendum)
Alternate tylenol with the prescription ibuprofen.  Drink plenty of fluids.  1-2 puffs of the albuterol inhaler every 4-6 hrs as needed.  You will be notified of any positive test results.    Your symptoms are likely related to a viral illness,  You should continue to observe the basic precautions which include frequent handwashing, avoid touching your face and observe social distancing and stay home to avoid contact with others until your test results are back.   You will need to stay home for at least 7 days AND have 3 consecutive days without symptoms OR fever without taking any fever reducers such as tylenol or ibuprofen.  Monitor your temperature daily.  Return to ER for any worsening symptoms

## 2018-08-13 NOTE — ED Triage Notes (Signed)
Pt with dry cough started yesterday and fever today (101.2).  Denies any exposure to anyone with covid 19

## 2018-08-13 NOTE — ED Provider Notes (Signed)
[facility] EMERGENCY DEPARTMENT Provider Note   CSN: [id number] Arrival date & time: 08/13/18  2113    History   Chief Complaint Chief Complaint  Patient presents with  . Cough    HPI Arlana HoveKameron S Hessler is a 23 y.o. male.     HPI  Arlana HoveKameron S Buzby is a 23 y.o. male who presents to the Emergency Department complaining of fever, generalized body aches and cough.  Symptoms began today.  Max fever of 101.2 orally this morning. States he took tylenol earlier along with an OTC cold medication with some improvement.  Describes his cough as non-productive.  He denies shortness of breath, sore throat, vomiting or diarrhea.  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   Past Medical History:  Diagnosis Date  . Hypertension     There are no active problems to display for this patient.   History reviewed. No pertinent surgical history.    Home Medications    Prior to Admission medications   Medication Sig Start Date End Date Taking? Authorizing Provider  acetaminophen (TYLENOL) 500 MG tablet Take 500 mg by mouth every 6 (six) hours as needed for mild pain or moderate pain.   Yes [provider]  lisinopril (PRINIVIL,ZESTRIL) 10 MG tablet Take 1 tablet (10 mg total) by mouth daily. (Needs to be seen before next refill) Patient taking differently: Take 10 mg by mouth daily.  05/13/18  Yes Rakes, Doralee AlbinoLinda M, FNP  albuterol (VENTOLIN HFA) 108 (90 Base) MCG/ACT inhaler Inhale 1-2 puffs into the lungs every 6 (six) hours as needed for wheezing or shortness of breath. 08/13/18   Joy Reiger, PA-C  benzonatate (TESSALON) 200 MG capsule Take 1 capsule (200 mg total) by mouth 3 (three) times daily as needed for cough. Swallow whole, do not chew 08/13/18   Geremiah Fussell, PA-C  ibuprofen (ADVIL) 600 MG tablet Take 1 tablet (600 mg total) by mouth every 6 (six) hours as needed for fever. 08/13/18   Pauline Ausriplett, Korra Christine, PA-C    Family History Family History   Problem Relation Age of Onset  . Vision loss Mother   . Vision loss Brother     Social History Social History   Tobacco Use  . Smoking status: Current Every Day Smoker    Packs/day: 0.50    Years: 3.00    Pack years: 1.50    Types: Cigarettes  . Smokeless tobacco: Never Used  Substance Use Topics  . Alcohol use: No  . Drug use: No     Allergies   Patient has no known allergies.   Review of Systems Review of Systems  Constitutional: Positive for fever. Negative for appetite change and chills.  HENT: Negative for congestion, rhinorrhea, sore throat and trouble swallowing.   Respiratory: Positive for cough. Negative for chest tightness, shortness of breath and wheezing.   Cardiovascular: Negative for chest pain.  Gastrointestinal: Negative for abdominal pain, diarrhea, nausea and vomiting.  Musculoskeletal: Positive for myalgias. Negative for arthralgias, neck pain and neck stiffness.  Skin: Negative for rash.  Neurological: Negative for dizziness, weakness and numbness.  Hematological: Negative for adenopathy.     Physical Exam Updated Vital Signs BP (!) 149/84   Pulse 96   Temp (!) 101.3 F (38.5 C) (Oral)   Resp 17   Ht 6' (1.829 m)   Wt 89.4 kg   SpO2 99%   BMI 26.72 kg/m   Physical Exam Vitals signs and nursing note reviewed.  Constitutional:      Appearance: Normal appearance.  HENT:     Nose: Nose normal.     Mouth/Throat:     Mouth: Mucous membranes are moist.  Neck:     Musculoskeletal: Normal range of motion. No neck rigidity or muscular tenderness.  Cardiovascular:     Rate and Rhythm: Normal rate and regular rhythm.     Pulses: Normal pulses.  Pulmonary:     Effort: Pulmonary effort is normal.     Breath sounds: Normal breath sounds. No wheezing or rales.  Chest:     Chest wall: No tenderness.  Musculoskeletal: Normal range of motion.  Skin:    General: Skin is warm.     Capillary Refill: Capillary refill takes less than 2 seconds.      Findings: No rash.  Neurological:     General: No focal deficit present.     Mental Status: He is alert.      ED Treatments / Results  Labs (all labs ordered are listed, but only abnormal results are displayed) Labs Reviewed  NOVEL CORONAVIRUS, NAA (HOSPITAL ORDER, SEND-OUT TO REF LAB)    EKG None  Radiology Dg Chest Portable 1 View  Result Date: 08/13/2018 CLINICAL DATA:  Onset cough yesterday.  Fever today. EXAM: PORTABLE CHEST 1 VIEW COMPARISON:  None. FINDINGS: Lungs clear. Heart size normal. No pneumothorax or pleural fluid. No bony abnormality. IMPRESSION: Negative chest. Electronically Signed   By: Drusilla Kanner M.D.   On: 08/13/2018 22:06    Procedures Procedures (including critical care time)  Medications Ordered in ED Medications  ibuprofen (ADVIL) tablet 800 mg (800 mg Oral Given 08/13/18 2333)     Initial Impression / Assessment and Plan / ED Course  I have reviewed the triage vital signs and the nursing notes.  Pertinent labs & imaging results that were available during my care of the patient were reviewed by me and considered in my medical decision making (see chart for details).        CXR neg for PNA.  Vitals reviewed.  No dyspnea or hypoxia.  He is well appearing.  Sx's likely viral.  COVID testing pending and he will be given quarantine at home instructions.  Appears appropriate for d/c home, agrees to plan and verbalized understanding.    Final Clinical Impressions(s) / ED Diagnoses   Final diagnoses:  Cough    ED Discharge Orders         Ordered    albuterol (VENTOLIN HFA) 108 (90 Base) MCG/ACT inhaler  Every 6 hours PRN     08/13/18 2322    benzonatate (TESSALON) 200 MG capsule  3 times daily PRN     08/13/18 2322    ibuprofen (ADVIL) 600 MG tablet  Every 6 hours PRN     08/13/18 2322           Pauline Aus, PA-C 08/13/18 2359    Donnetta Hutching, MD 08/17/18 [phone]

## 2018-08-15 ENCOUNTER — Encounter (HOSPITAL_COMMUNITY): Payer: Self-pay | Admitting: Emergency Medicine

## 2018-08-15 ENCOUNTER — Other Ambulatory Visit: Payer: Self-pay

## 2018-08-15 ENCOUNTER — Emergency Department (HOSPITAL_COMMUNITY)
Admission: EM | Admit: 2018-08-15 | Discharge: 2018-08-15 | Disposition: A | Discharge status: Home / Self Care | Payer: Self-pay | Attending: Emergency Medicine | Admitting: Emergency Medicine

## 2018-08-15 DIAGNOSIS — F17210 Nicotine dependence, cigarettes, uncomplicated: Secondary | ICD-10-CM | POA: Insufficient documentation

## 2018-08-15 DIAGNOSIS — J029 Acute pharyngitis, unspecified: Secondary | ICD-10-CM | POA: Insufficient documentation

## 2018-08-15 DIAGNOSIS — I10 Essential (primary) hypertension: Secondary | ICD-10-CM | POA: Insufficient documentation

## 2018-08-15 DIAGNOSIS — Z79899 Other long term (current) drug therapy: Secondary | ICD-10-CM | POA: Insufficient documentation

## 2018-08-15 DIAGNOSIS — J069 Acute upper respiratory infection, unspecified: Secondary | ICD-10-CM | POA: Insufficient documentation

## 2018-08-15 LAB — NOVEL CORONAVIRUS, NAA (HOSP ORDER, SEND-OUT TO REF LAB; TAT 18-24 HRS): SARS-CoV-2, NAA: NOT DETECTED

## 2018-08-15 LAB — GROUP A STREP BY PCR: Group A Strep by PCR: NOT DETECTED

## 2018-08-15 MED ORDER — AMOXICILLIN 500 MG PO CAPS: 500.0000 mg | ORAL_CAPSULE | Freq: Three times a day (TID) | ORAL | 0 refills | Status: DC

## 2018-08-15 MED ORDER — AMOXICILLIN 250 MG PO CAPS
500.0000 mg | ORAL_CAPSULE | Freq: Once | ORAL | Status: AC
  Administered 2018-08-15: 500 mg via ORAL
  Filled 2018-08-15: qty 2

## 2018-08-15 MED ORDER — IBUPROFEN 800 MG PO TABS
800.0000 mg | ORAL_TABLET | Freq: Once | ORAL | Status: AC
  Administered 2018-08-15: 800 mg via ORAL
  Filled 2018-08-15: qty 1

## 2018-08-15 MED ORDER — ACETAMINOPHEN 325 MG PO TABS
650.0000 mg | ORAL_TABLET | Freq: Once | ORAL | Status: AC
  Administered 2018-08-15: 650 mg via ORAL
  Filled 2018-08-15: qty 2

## 2018-08-15 NOTE — Discharge Instructions (Addendum)
Your temperature today is 99.1.  Your oxygen level is 98% on room air.  The strep test is negative.  Please use the Amoxil 3 times daily for possible atypical infection.  Please use your mask at all times.  Please keep 6 feet or more between you and others until symptoms resolve.  Your COVID-19 test is negative.  Salt water gargles will be helpful.  Chloraseptic spray may also help with your throat discomfort.  Please use ibuprofen 600 mg with breakfast, lunch, dinner, and at bedtime.  Wash hands frequently.  Wipe off surfaces as needed.  Please see your primary physician or return to the emergency department if any worsening of your symptoms, changes in your condition, problems, or concerns.

## 2018-08-15 NOTE — ED Triage Notes (Signed)
Pt c/o sore throat, fever, and difficulty swallowing x 3 days. Pt states he has white spots on back of throat.

## 2018-08-15 NOTE — ED Provider Notes (Signed)
[facility] Provider Note   CSN: [id number] Arrival date & time: 08/15/18  [id number]    History   Chief Complaint Chief Complaint  Patient presents with  . Sore Throat    HPI Eric Trevino is a 23 y.o. male.     Patient is a 23 year old male who presents to the emergency department with a complaint of sore throat.  The patient states that over the last 3 days Eric Trevino has been having problems with sore throat, difficulty swallowing.  Eric Trevino has had problems with fever in the past.  Eric Trevino was seen in the emergency department on May 12 with similar symptoms.  Eric Trevino had a COVID-19 test done, but the results have not returned yet.  Patient states that Eric Trevino now notices white spots on the back of his throat and swelling.  Eric Trevino felt that Eric Trevino was having some difficulty not only swallowing but at times breathing on last evening.  Eric Trevino says this is improved this morning but Eric Trevino has a lot of soreness of his throat particularly trying to swallow.  Eric Trevino also has some mild headache.  Eric Trevino presents now for additional evaluation.  The history is provided by the patient.  Sore Throat  Associated symptoms include headaches. Pertinent negatives include no chest pain, no abdominal pain and no shortness of breath.    Past Medical History:  Diagnosis Date  . Hypertension     There are no active problems to display for this patient.   History reviewed. No pertinent surgical history.      Home Medications    Prior to Admission medications   Medication Sig Start Date End Date Taking? Authorizing Provider  lisinopril (PRINIVIL,ZESTRIL) 10 MG tablet Take 1 tablet (10 mg total) by mouth daily. (Needs to be seen before next refill) Patient taking differently: Take 10 mg by mouth daily.  05/13/18  Yes Rakes, Doralee AlbinoLinda M, FNP  albuterol (VENTOLIN HFA) 108 (90 Base) MCG/ACT inhaler Inhale 1-2 puffs into the lungs every 6 (six) hours as needed for wheezing or shortness of breath. 08/13/18   Triplett, Tammy, PA-C   benzonatate (TESSALON) 200 MG capsule Take 1 capsule (200 mg total) by mouth 3 (three) times daily as needed for cough. Swallow whole, do not chew 08/13/18   Triplett, Tammy, PA-C  ibuprofen (ADVIL) 600 MG tablet Take 1 tablet (600 mg total) by mouth every 6 (six) hours as needed for fever. 08/13/18   Pauline Ausriplett, Tammy, PA-C    Family History Family History  Problem Relation Age of Onset  . Vision loss Mother   . Vision loss Brother     Social History Social History   Tobacco Use  . Smoking status: Current Every Day Smoker    Packs/day: 0.50    Years: 3.00    Pack years: 1.50    Types: Cigarettes  . Smokeless tobacco: Never Used  Substance Use Topics  . Alcohol use: No  . Drug use: No     Allergies   Patient has no known allergies.   Review of Systems Review of Systems  Constitutional: Positive for fever. Negative for activity change.       All ROS Neg except as noted in HPI  HENT: Positive for sore throat and trouble swallowing. Negative for nosebleeds.   Eyes: Negative for photophobia and discharge.  Respiratory: Negative for cough, shortness of breath and wheezing.   Cardiovascular: Negative for chest pain and palpitations.  Gastrointestinal: Negative for abdominal pain and blood in stool.  Genitourinary:  Negative for dysuria, frequency and hematuria.  Musculoskeletal: Positive for myalgias. Negative for arthralgias, back pain and neck pain.  Skin: Negative.   Neurological: Positive for headaches. Negative for dizziness, seizures and speech difficulty.  Psychiatric/Behavioral: Negative for confusion and hallucinations.     Physical Exam Updated Vital Signs BP (!) 147/84   Pulse 68   Temp 99.1 F (37.3 C) (Oral)   Resp 16   Ht 6' (1.829 m)   Wt 88.9 kg   SpO2 98%   BMI 26.58 kg/m   Physical Exam Vitals signs and nursing note reviewed.  Constitutional:      Appearance: Eric Trevino is well-developed. Eric Trevino is not toxic-appearing.  HENT:     Head: Normocephalic.      Right Ear: External ear normal.     Left Ear: External ear normal.     Mouth/Throat:     Pharynx: Pharyngeal swelling, oropharyngeal exudate, posterior oropharyngeal erythema and uvula swelling present.  Eyes:     General: Lids are normal.     Pupils: Pupils are equal, round, and reactive to light.  Neck:     Musculoskeletal: Normal range of motion and neck supple.     Vascular: No carotid bruit.  Cardiovascular:     Rate and Rhythm: Normal rate and regular rhythm.     Pulses: Normal pulses.     Heart sounds: Normal heart sounds.  Pulmonary:     Effort: No respiratory distress.     Breath sounds: Rhonchi present.  Abdominal:     General: Bowel sounds are normal.     Palpations: Abdomen is soft.     Tenderness: There is no abdominal tenderness. There is no guarding.  Musculoskeletal: Normal range of motion.  Lymphadenopathy:     Head:     Right side of head: No submandibular adenopathy.     Left side of head: No submandibular adenopathy.     Cervical: No cervical adenopathy.  Skin:    General: Skin is warm and dry.     Capillary Refill: Capillary refill takes less than 2 seconds.     Findings: No rash.  Neurological:     Mental Status: Eric Trevino is alert and oriented to person, place, and time.     Cranial Nerves: No cranial nerve deficit.     Sensory: No sensory deficit.  Psychiatric:        Speech: Speech normal.      ED Treatments / Results  Labs (all labs ordered are listed, but only abnormal results are displayed) Labs Reviewed  GROUP A STREP BY PCR    EKG None  Radiology Dg Chest Portable 1 View  Result Date: 08/13/2018 CLINICAL DATA:  Onset cough yesterday.  Fever today. EXAM: PORTABLE CHEST 1 VIEW COMPARISON:  None. FINDINGS: Lungs clear. Heart size normal. No pneumothorax or pleural fluid. No bony abnormality. IMPRESSION: Negative chest. Electronically Signed   By: Drusilla Kanner M.D.   On: 08/13/2018 22:06    Procedures Procedures (including critical care  time)  Medications Ordered in ED Medications  amoxicillin (AMOXIL) capsule 500 mg (has no administration in time range)  ibuprofen (ADVIL) tablet 800 mg (800 mg Oral Given 08/15/18 1133)  acetaminophen (TYLENOL) tablet 650 mg (650 mg Oral Given 08/15/18 1134)     Initial Impression / Assessment and Plan / ED Course  I have reviewed the triage vital signs and the nursing notes.  Pertinent labs & imaging results that were available during my care of the patient were reviewed by  me and considered in my medical decision making (see chart for details).          Final Clinical Impressions(s) / ED Diagnoses MDM  Vital signs reviewed.  Pulse oximetry is 98% on room air.  Within normal limits by my interpretation.  Patient was tested for COVID-19 approximately 48 hours ago.  Test results pending.  The patient has increased redness and some swelling of the posterior pharynx and uvula.  There is exudate present.  Strep screen obtained.  Strep screen is negative.  The patient will be asked to use salt water gargles and Chloraseptic spray.  Have asked him to use his mask continuously.  I have asked him to maintain social distancing of 6 feet or more from others.  We discussed the importance of good handwashing and good hydration.  The patient will be treated with Amoxil for possible atypical infection.  The patient is advised to self quarantine until Eric Trevino received the result of his COVID-19 test.  Patient acknowledges understanding of these instructions.   Final diagnoses:  Pharyngitis, unspecified etiology  Upper respiratory tract infection, unspecified type    ED Discharge Orders         Ordered    amoxicillin (AMOXIL) 500 MG capsule  3 times daily     08/15/18 1157           Ivery Quale, PA-C 08/15/18 1201    Samuel Jester, DO 08/19/18 1022

## 2019-02-02 IMAGING — DX DG FOOT COMPLETE 3+V*L*
3 series · 3 of 3 positions shown · non-contrast
Comparison: None in PACs

CLINICAL DATA: Chronic left ankle pain

EXAM:
LEFT FOOT - COMPLETE 3+ VIEW

[foot ap]
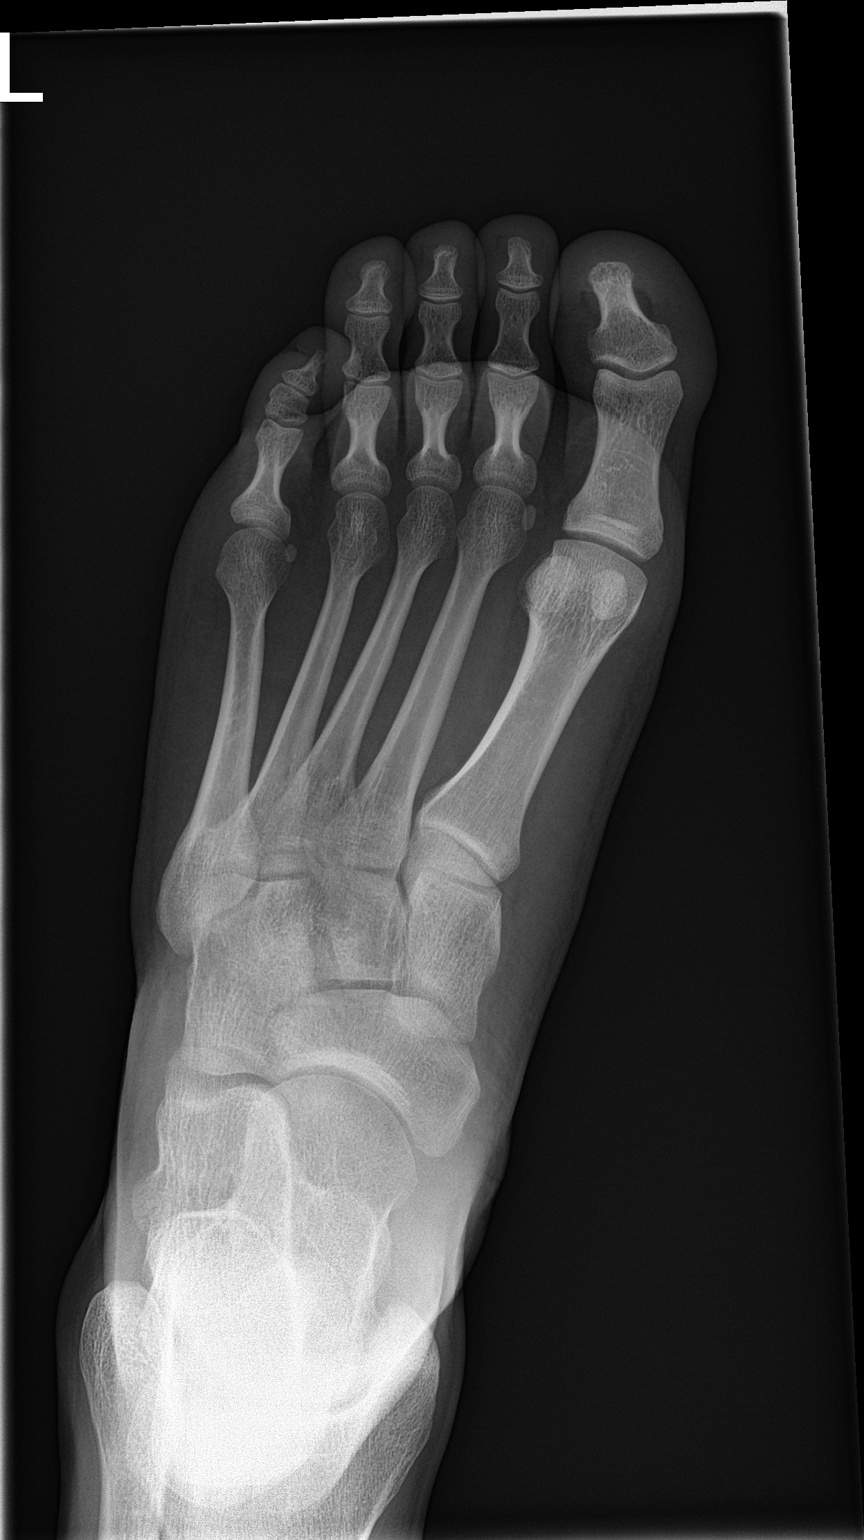

[foot obl]
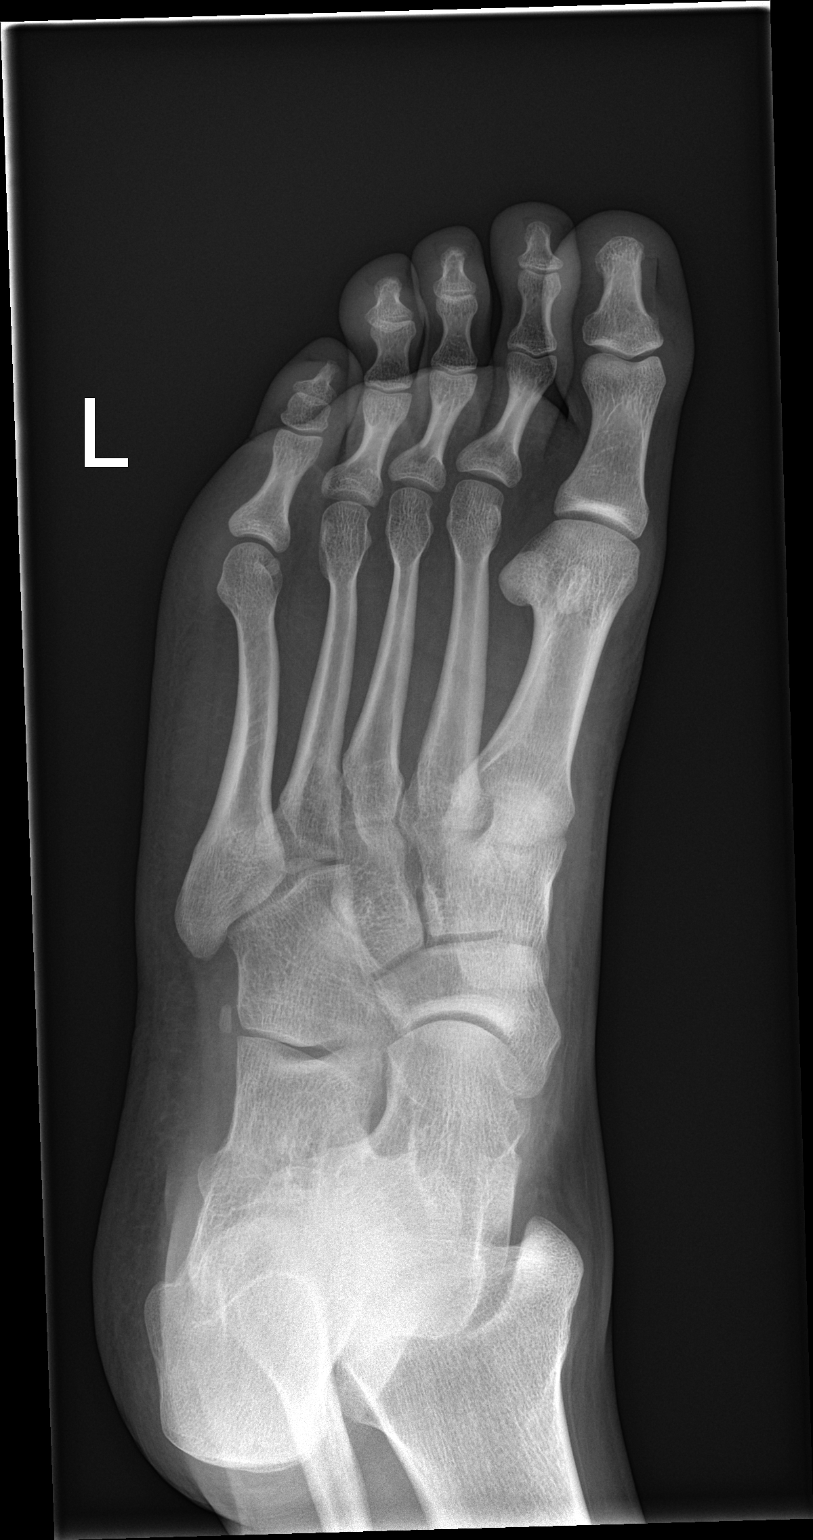

[foot lat]
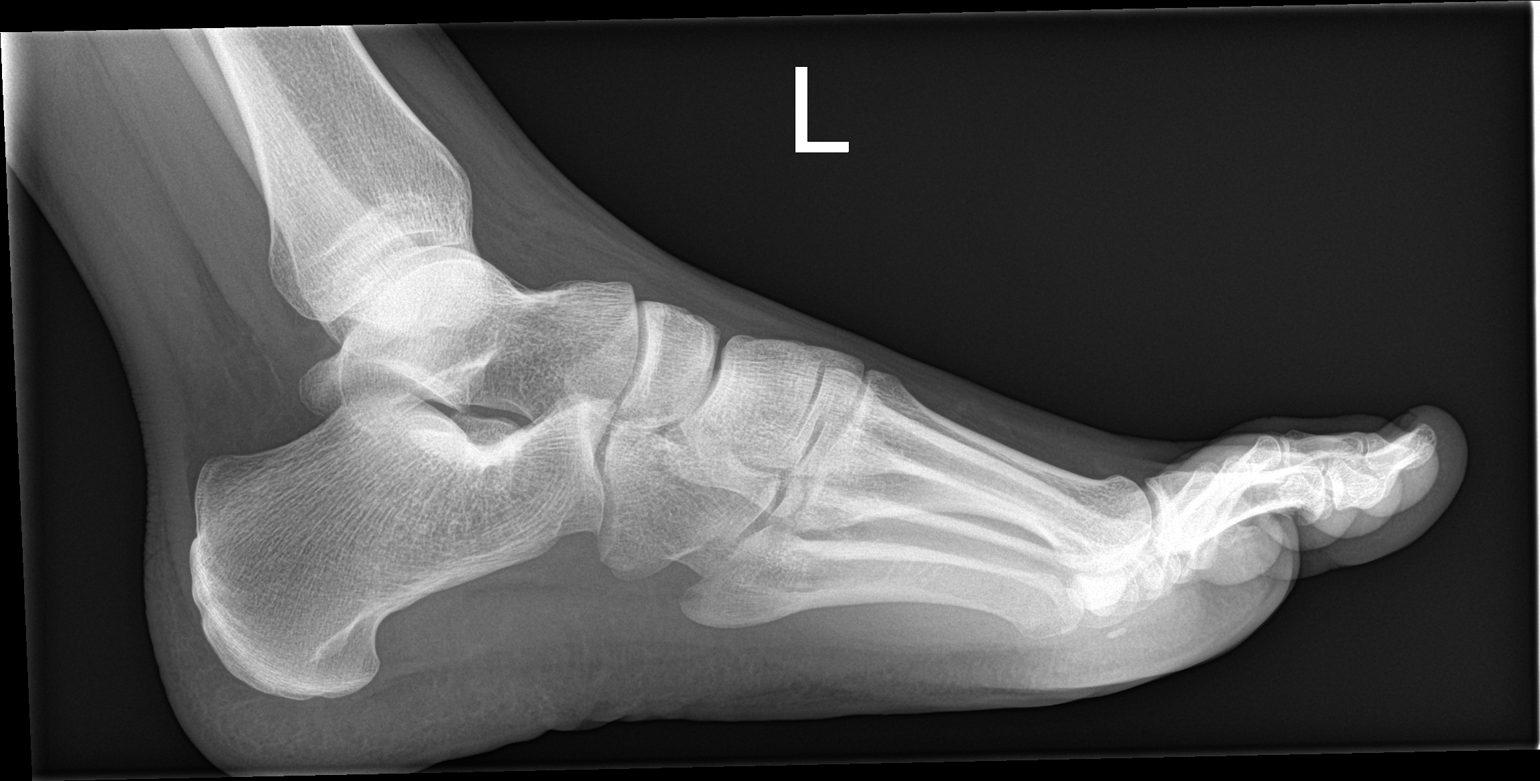

[3 of 3 positions shown; findings below may reference images not displayed]

FINDINGS: The bones of the foot are subjectively adequately mineralized. The
phalanges and metatarsals are intact. The joint spaces are well
maintained. The tarsal bones are intact. The limited visualization
of the distal radius and ulna reveals no acute abnormality. The soft
tissues are unremarkable.
IMPRESSION: There is no acute or significant chronic bony abnormality of the
left foot.

## 2019-07-07 ENCOUNTER — Encounter: Payer: Self-pay | Admitting: Family Medicine

## 2019-07-07 ENCOUNTER — Ambulatory Visit (INDEPENDENT_AMBULATORY_CARE_PROVIDER_SITE_OTHER): Payer: Self-pay | Admitting: Family Medicine

## 2019-07-07 ENCOUNTER — Other Ambulatory Visit: Payer: Self-pay

## 2019-07-07 DIAGNOSIS — I10 Essential (primary) hypertension: Secondary | ICD-10-CM

## 2019-07-07 MED ORDER — LISINOPRIL 10 MG PO TABS: 10.0000 mg | ORAL_TABLET | Freq: Every day | ORAL | 1 refills | Status: DC

## 2019-07-07 NOTE — Patient Instructions (Signed)
DASH Eating Plan DASH stands for "Dietary Approaches to Stop Hypertension." The DASH eating plan is a healthy eating plan that has been shown to reduce high blood pressure (hypertension). It may also reduce your risk for type 2 diabetes, heart disease, and stroke. The DASH eating plan may also help with weight loss. What are tips for following this plan?  General guidelines  Avoid eating more than 2,300 mg (milligrams) of salt (sodium) a day. If you have hypertension, you may need to reduce your sodium intake to 1,500 mg a day.  Limit alcohol intake to no more than 1 drink a day for nonpregnant women and 2 drinks a day for men. One drink equals 12 oz of beer, 5 oz of wine, or 1 oz of hard liquor.  Work with your health care provider to maintain a healthy body weight or to lose weight. Ask what an ideal weight is for you.  Get at least 30 minutes of exercise that causes your heart to beat faster (aerobic exercise) most days of the week. Activities may include walking, swimming, or biking.  Work with your health care provider or diet and nutrition specialist (dietitian) to adjust your eating plan to your individual calorie needs. Reading food labels   Check food labels for the amount of sodium per serving. Choose foods with less than 5 percent of the Daily Value of sodium. Generally, foods with less than 300 mg of sodium per serving fit into this eating plan.  To find whole grains, look for the word "whole" as the first word in the ingredient list. Shopping  Buy products labeled as "low-sodium" or "no salt added."  Buy fresh foods. Avoid canned foods and premade or frozen meals. Cooking  Avoid adding salt when cooking. Use salt-free seasonings or herbs instead of table salt or sea salt. Check with your health care provider or pharmacist before using salt substitutes.  Do not fry foods. Cook foods using healthy methods such as baking, boiling, grilling, and broiling instead.  Cook with  heart-healthy oils, such as olive, canola, soybean, or sunflower oil. Meal planning  Eat a balanced diet that includes: ? 5 or more servings of fruits and vegetables each day. At each meal, try to fill half of your plate with fruits and vegetables. ? Up to 6-8 servings of whole grains each day. ? Less than 6 oz of lean meat, poultry, or fish each day. A 3-oz serving of meat is about the same size as a deck of cards. One egg equals 1 oz. ? 2 servings of low-fat dairy each day. ? A serving of nuts, seeds, or beans 5 times each week. ? Heart-healthy fats. Healthy fats called Omega-3 fatty acids are found in foods such as flaxseeds and coldwater fish, like sardines, salmon, and mackerel.  Limit how much you eat of the following: ? Canned or prepackaged foods. ? Food that is high in trans fat, such as fried foods. ? Food that is high in saturated fat, such as fatty meat. ? Sweets, desserts, sugary drinks, and other foods with added sugar. ? Full-fat dairy products.  Do not salt foods before eating.  Try to eat at least 2 vegetarian meals each week.  Eat more home-cooked food and less restaurant, buffet, and fast food.  When eating at a restaurant, ask that your food be prepared with less salt or no salt, if possible. What foods are recommended? The items listed may not be a complete list. Talk with your dietitian about   what dietary choices are best for you. Grains Whole-grain or whole-wheat bread. Whole-grain or whole-wheat pasta. Brown rice. Oatmeal. Quinoa. Bulgur. Whole-grain and low-sodium cereals. Pita bread. Low-fat, low-sodium crackers. Whole-wheat flour tortillas. Vegetables Fresh or frozen vegetables (raw, steamed, roasted, or grilled). Low-sodium or reduced-sodium tomato and vegetable juice. Low-sodium or reduced-sodium tomato sauce and tomato paste. Low-sodium or reduced-sodium canned vegetables. Fruits All fresh, dried, or frozen fruit. Canned fruit in natural juice (without  added sugar). Meat and other protein foods Skinless chicken or turkey. Ground chicken or turkey. Pork with fat trimmed off. Fish and seafood. Egg whites. Dried beans, peas, or lentils. Unsalted nuts, nut butters, and seeds. Unsalted canned beans. Lean cuts of beef with fat trimmed off. Low-sodium, lean deli meat. Dairy Low-fat (1%) or fat-free (skim) milk. Fat-free, low-fat, or reduced-fat cheeses. Nonfat, low-sodium ricotta or cottage cheese. Low-fat or nonfat yogurt. Low-fat, low-sodium cheese. Fats and oils Soft margarine without trans fats. Vegetable oil. Low-fat, reduced-fat, or light mayonnaise and salad dressings (reduced-sodium). Canola, safflower, olive, soybean, and sunflower oils. Avocado. Seasoning and other foods Herbs. Spices. Seasoning mixes without salt. Unsalted popcorn and pretzels. Fat-free sweets. What foods are not recommended? The items listed may not be a complete list. Talk with your dietitian about what dietary choices are best for you. Grains Baked goods made with fat, such as croissants, muffins, or some breads. Dry pasta or rice meal packs. Vegetables Creamed or fried vegetables. Vegetables in a cheese sauce. Regular canned vegetables (not low-sodium or reduced-sodium). Regular canned tomato sauce and paste (not low-sodium or reduced-sodium). Regular tomato and vegetable juice (not low-sodium or reduced-sodium). Pickles. Olives. Fruits Canned fruit in a light or heavy syrup. Fried fruit. Fruit in cream or butter sauce. Meat and other protein foods Fatty cuts of meat. Ribs. Fried meat. Bacon. Sausage. Bologna and other processed lunch meats. Salami. Fatback. Hotdogs. Bratwurst. Salted nuts and seeds. Canned beans with added salt. Canned or smoked fish. Whole eggs or egg yolks. Chicken or turkey with skin. Dairy Whole or 2% milk, cream, and half-and-half. Whole or full-fat cream cheese. Whole-fat or sweetened yogurt. Full-fat cheese. Nondairy creamers. Whipped toppings.  Processed cheese and cheese spreads. Fats and oils Butter. Stick margarine. Lard. Shortening. Ghee. Bacon fat. Tropical oils, such as coconut, palm kernel, or palm oil. Seasoning and other foods Salted popcorn and pretzels. Onion salt, garlic salt, seasoned salt, table salt, and sea salt. Worcestershire sauce. Tartar sauce. Barbecue sauce. Teriyaki sauce. Soy sauce, including reduced-sodium. Steak sauce. Canned and packaged gravies. Fish sauce. Oyster sauce. Cocktail sauce. Horseradish that you find on the shelf. Ketchup. Mustard. Meat flavorings and tenderizers. Bouillon cubes. Hot sauce and Tabasco sauce. Premade or packaged marinades. Premade or packaged taco seasonings. Relishes. Regular salad dressings. Where to find more information:  National Heart, Lung, and Blood Institute: www.nhlbi.nih.gov  American Heart Association: www.heart.org Summary  The DASH eating plan is a healthy eating plan that has been shown to reduce high blood pressure (hypertension). It may also reduce your risk for type 2 diabetes, heart disease, and stroke.  With the DASH eating plan, you should limit salt (sodium) intake to 2,300 mg a day. If you have hypertension, you may need to reduce your sodium intake to 1,500 mg a day.  When on the DASH eating plan, aim to eat more fresh fruits and vegetables, whole grains, lean proteins, low-fat dairy, and heart-healthy fats.  Work with your health care provider or diet and nutrition specialist (dietitian) to adjust your eating plan to your   individual calorie needs. This information is not intended to replace advice given to you by your health care provider. Make sure you discuss any questions you have with your health care provider. Document Revised: 03/02/2017 Document Reviewed: 03/13/2016 Elsevier Patient Education  2020 Elsevier Inc.  

## 2019-07-07 NOTE — Progress Notes (Signed)
Assessment & Plan:  1. Essential hypertension - Started back on Lisinopril 10 mg PO QD. Sent to [facility] so he can use a GoodRx discount. Education provided on the DASH diet.  - lisinopril (ZESTRIL) 10 MG tablet; Take 1 tablet (10 mg total) by mouth daily.  Dispense: 90 tablet; Refill: 1   Return in about 4 weeks (around 08/04/2019) for HTN.  Deliah Boston, MSN, APRN, FNP-C [facility]  Subjective:    Patient ID: Eric Trevino, male    DOB: Sep 26, 1995, 24 y.o.   MRN: [id number]  Patient Care Team: Gwenlyn Fudge, FNP as PCP - General (Family Medicine)   Chief Complaint:  Chief Complaint  Patient presents with  . Establish Care    [facility] patient   . Hypertension    Patient has been off since Jan due to no insurance.    HPI: Eric Trevino is a 24 y.o. male presenting on 07/07/2019 for Establish Care [facility] patient ) and Hypertension (Patient has been off since Jan due to no insurance.)  Patient has been off his blood pressure medication since January due to losing insurance. He feels he needs the medication back as he has been experiencing heart racing and light headedness. Reports BP was well controlled previously on medication.    New complaints: None  Social history:  Relevant past medical, surgical, family and social history reviewed and updated as indicated. Interim medical history since our last visit reviewed.  Allergies and medications reviewed and updated.  DATA REVIEWED: CHART IN EPIC  ROS: Negative unless specifically indicated above in HPI.    Current Outpatient Medications:  .  lisinopril (ZESTRIL) 10 MG tablet, Take 1 tablet (10 mg total) by mouth daily., Disp: 90 tablet, Rfl: 1   No Known Allergies Past Medical History:  Diagnosis Date  . Hypertension     History reviewed. No pertinent surgical history.  Social History   Socioeconomic History  . Marital status: Single    Spouse name: Not on file  . Number of  children: Not on file  . Years of education: Not on file  . Highest education level: Not on file  Occupational History  . Not on file  Tobacco Use  . Smoking status: Current Every Day Smoker    Packs/day: 1.00    Years: 3.00    Pack years: 3.00    Types: Cigarettes  . Smokeless tobacco: Never Used  Substance and Sexual Activity  . Alcohol use: No  . Drug use: No  . Sexual activity: Yes  Other Topics Concern  . Not on file  Social History Narrative  . Not on file   Social Determinants of Health   Financial Resource Strain:   . Difficulty of Paying Living Expenses:   Food Insecurity:   . Worried About Programme researcher, broadcasting/film/video in the Last Year:   . Barista in the Last Year:   Transportation Needs:   . Freight forwarder (Medical):   Marland Kitchen Lack of Transportation (Non-Medical):   Physical Activity:   . Days of Exercise per Week:   . Minutes of Exercise per Session:   Stress:   . Feeling of Stress :   Social Connections:   . Frequency of Communication with Friends and Family:   . Frequency of Social Gatherings with Friends and Family:   . Attends Religious Services:   . Active Member of Clubs or Organizations:   . Attends Banker Meetings:   .  Marital Status:   Intimate Partner Violence:   . Fear of Current or Ex-Partner:   . Emotionally Abused:   Marland Kitchen Physically Abused:   . Sexually Abused:         Objective:    BP (!) 150/90   Pulse 86   Temp 97.8 F (36.6 C) (Temporal)   Ht 5\' 10"  (1.778 m)   Wt 217 lb 3.2 oz (98.5 kg)   SpO2 99%   BMI 31.16 kg/m   Wt Readings from Last 3 Encounters:  07/07/19 217 lb 3.2 oz (98.5 kg)  08/15/18 196 lb (88.9 kg)  08/13/18 197 lb (89.4 kg)    Physical Exam Vitals reviewed.  Constitutional:      General: He is not in acute distress.    Appearance: Normal appearance. He is obese. He is not ill-appearing, toxic-appearing or diaphoretic.  HENT:     Head: Normocephalic and atraumatic.  Eyes:     General:  No scleral icterus.       Right eye: No discharge.        Left eye: No discharge.     Conjunctiva/sclera: Conjunctivae normal.  Cardiovascular:     Rate and Rhythm: Normal rate and regular rhythm.     Heart sounds: Normal heart sounds. No murmur. No friction rub. No gallop.   Pulmonary:     Effort: Pulmonary effort is normal. No respiratory distress.     Breath sounds: Normal breath sounds. No stridor. No wheezing, rhonchi or rales.  Musculoskeletal:        General: Normal range of motion.     Cervical back: Normal range of motion.  Skin:    General: Skin is warm and dry.  Neurological:     Mental Status: He is alert and oriented to person, place, and time. Mental status is at baseline.  Psychiatric:        Mood and Affect: Mood normal.        Behavior: Behavior normal.        Thought Content: Thought content normal.        Judgment: Judgment normal.     No results found for: TSH Lab Results  Component Value Date   WBC 7.1 03/05/2017   HGB 15.2 03/05/2017   HCT 45.2 03/05/2017   MCV 86 03/05/2017   PLT 257 03/05/2017   Lab Results  Component Value Date   NA 139 03/19/2017   K 4.3 03/19/2017   CO2 27 03/19/2017   GLUCOSE 81 03/19/2017   BUN 9 03/19/2017   CREATININE 0.88 03/19/2017   BILITOT 0.2 03/05/2017   ALKPHOS 76 03/05/2017   AST 22 03/05/2017   ALT 22 03/05/2017   PROT 7.8 03/05/2017   ALBUMIN 4.8 03/05/2017   CALCIUM 9.7 03/19/2017   No results found for: CHOL No results found for: HDL No results found for: LDLCALC No results found for: TRIG No results found for: CHOLHDL No results found for: HGBA1C

## 2019-08-07 ENCOUNTER — Ambulatory Visit: Payer: Self-pay | Admitting: Family Medicine

## 2020-01-06 ENCOUNTER — Other Ambulatory Visit: Payer: Self-pay | Admitting: Family Medicine

## 2020-01-06 DIAGNOSIS — I10 Essential (primary) hypertension: Secondary | ICD-10-CM

## 2020-02-09 ENCOUNTER — Other Ambulatory Visit: Payer: Self-pay | Admitting: Family Medicine

## 2020-02-09 DIAGNOSIS — I10 Essential (primary) hypertension: Secondary | ICD-10-CM

## 2020-02-09 NOTE — Telephone Encounter (Signed)
Eric Trevino NTBS 30 days given 01/07/20

## 2020-02-10 ENCOUNTER — Other Ambulatory Visit: Payer: Self-pay | Admitting: *Deleted

## 2020-02-10 DIAGNOSIS — I10 Essential (primary) hypertension: Secondary | ICD-10-CM

## 2020-02-10 MED ORDER — LISINOPRIL 10 MG PO TABS: 10.0000 mg | ORAL_TABLET | Freq: Every day | ORAL | 0 refills | Status: DC

## 2020-02-10 NOTE — Telephone Encounter (Signed)
Appointment scheduled.

## 2020-02-10 NOTE — Telephone Encounter (Signed)
Lisinopril denied - ntbs for further refills

## 2020-02-10 NOTE — Addendum Note (Signed)
Addended by: Lorelee Cover C on: 02/10/2020 04:20 PM   Modules accepted: Orders

## 2020-03-01 ENCOUNTER — Other Ambulatory Visit: Payer: Self-pay

## 2020-03-01 ENCOUNTER — Encounter: Payer: Self-pay | Admitting: Nurse Practitioner

## 2020-03-01 ENCOUNTER — Ambulatory Visit (INDEPENDENT_AMBULATORY_CARE_PROVIDER_SITE_OTHER): Payer: Self-pay | Admitting: Nurse Practitioner

## 2020-03-01 VITALS — BP 165/87 | HR 98 | Temp 98.2°F | Ht 70.0 in | Wt 233.2 lb

## 2020-03-01 DIAGNOSIS — I10 Essential (primary) hypertension: Secondary | ICD-10-CM

## 2020-03-01 DIAGNOSIS — R079 Chest pain, unspecified: Secondary | ICD-10-CM

## 2020-03-01 MED ORDER — LISINOPRIL 20 MG PO TABS: 20.0000 mg | ORAL_TABLET | Freq: Every day | ORAL | 0 refills | Status: DC

## 2020-03-01 NOTE — Progress Notes (Signed)
Established Patient Office Visit  Subjective:  Patient ID: Eric Trevino, male    DOB: [birth year]  Age: 24 y.o. MRN: [id number]  CC:  Chief Complaint  Patient presents with  . Medical Management of Chronic Issues    HPI Eric Trevino presents for Pt presents for follow up of hypertension. Patient was diagnosed in the last few months. The patient is tolerating the medication well without side effects.  Blood pressure is still elevated.  Patient is reporting compliance with treatment has been good; including taking medication as directed , maintains a healthy diet and regular exercise regimen, and following up as directed.  Current medication lisinopril 10 mg tablet daily.  Past Medical History:  Diagnosis Date  . Hypertension     History reviewed. No pertinent surgical history.  Family History  Problem Relation Age of Onset  . Vision loss Mother   . Vision loss Brother     Social History   Socioeconomic History  . Marital status: Single    Spouse name: Not on file  . Number of children: Not on file  . Years of education: Not on file  . Highest education level: Not on file  Occupational History  . Not on file  Tobacco Use  . Smoking status: Current Every Day Smoker    Packs/day: 1.00    Years: 3.00    Pack years: 3.00    Types: Cigarettes  . Smokeless tobacco: Never Used  Vaping Use  . Vaping Use: Never used  Substance and Sexual Activity  . Alcohol use: No  . Drug use: No  . Sexual activity: Yes  Other Topics Concern  . Not on file  Social History Narrative  . Not on file   Social Determinants of Health   Financial Resource Strain:   . Difficulty of Paying Living Expenses: Not on file  Food Insecurity:   . Worried About Programme researcher, broadcasting/film/video in the Last Year: Not on file  . Ran Out of Food in the Last Year: Not on file  Transportation Needs:   . Lack of Transportation (Medical): Not on file  . Lack of Transportation (Non-Medical): Not on file    Physical Activity:   . Days of Exercise per Week: Not on file  . Minutes of Exercise per Session: Not on file  Stress:   . Feeling of Stress : Not on file  Social Connections:   . Frequency of Communication with Friends and Family: Not on file  . Frequency of Social Gatherings with Friends and Family: Not on file  . Attends Religious Services: Not on file  . Active Member of Clubs or Organizations: Not on file  . Attends Banker Meetings: Not on file  . Marital Status: Not on file  Intimate Partner Violence:   . Fear of Current or Ex-Partner: Not on file  . Emotionally Abused: Not on file  . Physically Abused: Not on file  . Sexually Abused: Not on file    Outpatient Medications Prior to Visit  Medication Sig Dispense Refill  . lisinopril (ZESTRIL) 10 MG tablet Take 1 tablet (10 mg total) by mouth daily. (Needs to be seen before next refill) 30 tablet 0   No facility-administered medications prior to visit.    No Known Allergies  ROS Review of Systems  Respiratory: Positive for chest tightness. Negative for cough and shortness of breath.   Cardiovascular: Positive for chest pain. Negative for palpitations and leg swelling.  Neurological: Negative  for dizziness, light-headedness and headaches.  Psychiatric/Behavioral: Negative for agitation, behavioral problems and confusion. The patient is nervous/anxious.   All other systems reviewed and are negative.     Objective:    Physical Exam Vitals reviewed.  Constitutional:      Appearance: Normal appearance. He is normal weight.  HENT:     Head: Normocephalic.     Mouth/Throat:     Mouth: Mucous membranes are moist.     Pharynx: Oropharynx is clear.  Eyes:     Conjunctiva/sclera: Conjunctivae normal.  Cardiovascular:     Rate and Rhythm: Normal rate and regular rhythm.     Pulses: Normal pulses.     Heart sounds: Normal heart sounds.     Comments: Chest pressure/pain few times a week. Pulmonary:      Effort: Pulmonary effort is normal.     Breath sounds: Normal breath sounds.  Chest:     Chest wall: Tenderness present.  Abdominal:     General: Bowel sounds are normal.  Musculoskeletal:        General: Normal range of motion.     Cervical back: Normal range of motion.  Skin:    General: Skin is warm.  Neurological:     Mental Status: He is alert and oriented to person, place, and time.     BP (!) 165/87   Pulse 98   Temp 98.2 F (36.8 C)   Ht 5\' 10"  (1.778 m)   Wt 233 lb 3.2 oz (105.8 kg)   SpO2 98%   BMI 33.46 kg/m  Wt Readings from Last 3 Encounters:  03/01/20 233 lb 3.2 oz (105.8 kg)  07/07/19 217 lb 3.2 oz (98.5 kg)  08/15/18 196 lb (88.9 kg)     Health Maintenance Due  Topic Date Due  . Hepatitis C Screening  Never done  . COVID-19 Vaccine (1) Never done  . INFLUENZA VACCINE  Never done  . TETANUS/TDAP  11/09/2019     No results found for: TSH Lab Results  Component Value Date   WBC 7.1 03/05/2017   HGB 15.2 03/05/2017   HCT 45.2 03/05/2017   MCV 86 03/05/2017   PLT 257 03/05/2017   Lab Results  Component Value Date   NA 139 03/19/2017   K 4.3 03/19/2017   CO2 27 03/19/2017   GLUCOSE 81 03/19/2017   BUN 9 03/19/2017   CREATININE 0.88 03/19/2017   BILITOT 0.2 03/05/2017   ALKPHOS 76 03/05/2017   AST 22 03/05/2017   ALT 22 03/05/2017   PROT 7.8 03/05/2017   ALBUMIN 4.8 03/05/2017   CALCIUM 9.7 03/19/2017      Assessment & Plan:   Problem List Items Addressed This Visit      Cardiovascular and Mediastinum   Essential hypertension    Hypertension not well controlled.  Changed lisinopril 10 mg to 20 mg daily.  Advised patient to call in blood pressure log in 1 week and follow-up in 2 weeks.  Encourage patient to start smoking cessation, patient is at 1 pack a day.  And reporting chest pressure and shortness of breath.  Patient has had a 10-year plus smoking history.        Relevant Medications   lisinopril (ZESTRIL) 20 MG tablet      Other   Chest pain - Primary    Chest pain/chest pressure is new for patient in the last few months.  Patient is concerned because his mother passed away from a massive heart attack and his  uncle.  Completed comprehensive assessment on patient, EKG completed patient is sinus rhythm with no abnormalities showing on EKG strip., provided education to patient on signs and symptoms of MI, printed handouts given.  referral to cardiology completed, will follow up appropriately. Follow-up with worsening or unresolved symptoms.  Go to ER after hours.      Relevant Orders   EKG 12-Lead (Completed)      Meds ordered this encounter  Medications  . lisinopril (ZESTRIL) 20 MG tablet    Sig: Take 1 tablet (20 mg total) by mouth daily.    Dispense:  90 tablet    Refill:  0    Order Specific Question:   Supervising Provider    Answer:   Arville Care A [[id number]]    Follow-up: Return if symptoms worsen or fail to improve.    Daryll Drown, NP

## 2020-03-01 NOTE — Patient Instructions (Signed)

## 2020-03-01 NOTE — Assessment & Plan Note (Signed)
Hypertension not well controlled.  Changed lisinopril 10 mg to 20 mg daily.  Advised patient to call in blood pressure log in 1 week and follow-up in 2 weeks.  Encourage patient to start smoking cessation, patient is at 1 pack a day.  And reporting chest pressure and shortness of breath.  Patient has had a 10-year plus smoking history.

## 2020-03-01 NOTE — Assessment & Plan Note (Signed)
Chest pain/chest pressure is new for patient in the last few months.  Patient is concerned because his mother passed away from a massive heart attack and his uncle.  Completed comprehensive assessment on patient, EKG completed patient is sinus rhythm with no abnormalities showing on EKG strip., provided education to patient on signs and symptoms of MI, printed handouts given.  referral to cardiology completed, will follow up appropriately. Follow-up with worsening or unresolved symptoms.  Go to ER after hours.

## 2020-03-19 ENCOUNTER — Ambulatory Visit: Payer: Self-pay | Admitting: Interventional Cardiology

## 2020-06-05 IMAGING — CR PORTABLE CHEST - 1 VIEW
1 series · 1 of 1 positions shown · non-contrast
Comparison: None.

CLINICAL DATA: Onset cough yesterday.  Fever today.

EXAM:
PORTABLE CHEST 1 VIEW

[portable]
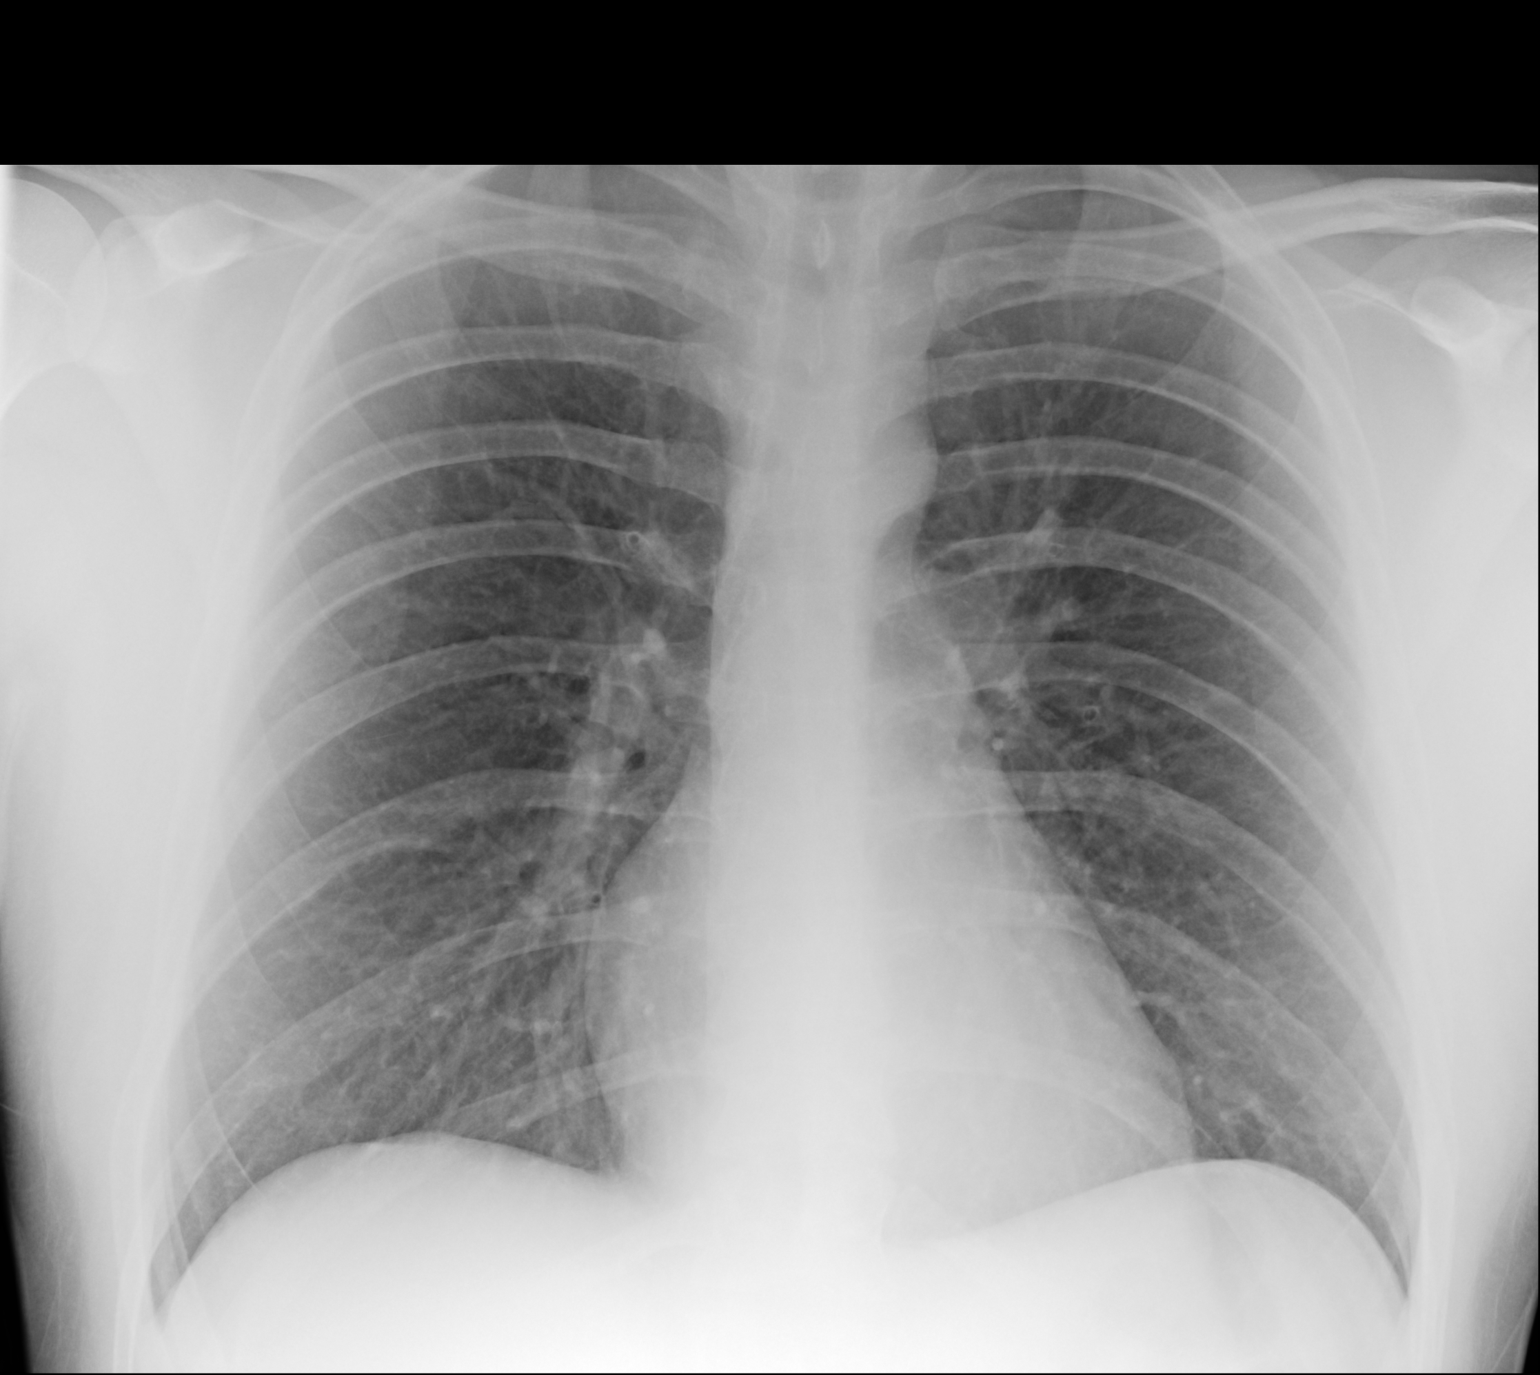

[1 of 1 positions shown; findings below may reference images not displayed]

FINDINGS: Lungs clear. Heart size normal. No pneumothorax or pleural fluid. No
bony abnormality.
IMPRESSION: Negative chest.

## 2020-08-31 ENCOUNTER — Other Ambulatory Visit: Payer: Self-pay

## 2020-08-31 MED ORDER — LISINOPRIL 20 MG PO TABS: 20.0000 mg | ORAL_TABLET | Freq: Every day | ORAL | 0 refills | Status: DC

## 2020-10-14 ENCOUNTER — Ambulatory Visit (INDEPENDENT_AMBULATORY_CARE_PROVIDER_SITE_OTHER): Payer: Self-pay | Admitting: Family Medicine

## 2020-10-14 ENCOUNTER — Encounter: Payer: Self-pay | Admitting: Family Medicine

## 2020-10-14 ENCOUNTER — Other Ambulatory Visit: Payer: Self-pay

## 2020-10-14 VITALS — BP 147/86 | HR 90 | Temp 98.3°F | Ht 70.0 in | Wt 227.8 lb

## 2020-10-14 DIAGNOSIS — I10 Essential (primary) hypertension: Secondary | ICD-10-CM

## 2020-10-14 DIAGNOSIS — E669 Obesity, unspecified: Secondary | ICD-10-CM

## 2020-10-14 DIAGNOSIS — Z5989 Other problems related to housing and economic circumstances: Secondary | ICD-10-CM

## 2020-10-14 MED ORDER — LISINOPRIL 20 MG PO TABS: 20.0000 mg | ORAL_TABLET | Freq: Every day | ORAL | 1 refills | Status: DC

## 2020-10-14 NOTE — Patient Instructions (Signed)
Send me a message on MyChart about your blood pressure.  [facility] [place]  Address: [address] [place], [place] [address] Phone: [phone]  Address: [address] [place], [place] [address] Phone: [phone]

## 2020-10-14 NOTE — Progress Notes (Signed)
Assessment & Plan:  1. Essential hypertension Uncontrolled without medication. I do not know how improved he is with medication; he was asked to check at [facility] and send me a MyChart message with this information.  - lisinopril (ZESTRIL) 20 MG tablet; Take 1 tablet (20 mg total) by mouth daily.  Dispense: 90 tablet; Refill: 1 - CMP14+EGFR; Future  2. Obesity (BMI 30.0-34.9) Diet and exercise encouraged.  3. Does not have health insurance Financial assistance form given to patient to complete.   Return in about 3 months (around 01/14/2021) for HTN.  Hendricks Limes, MSN, APRN, FNP-C [facility]  Subjective:    Patient ID: Eric Trevino, male    DOB: 1995/09/09, 25 y.o.   MRN: [id number]  Patient Care Team: Loman Brooklyn, FNP as PCP - General (Family Medicine) Jettie Booze, MD as PCP - Cardiology (Cardiology)   Chief Complaint:  Chief Complaint  Patient presents with   Hypertension    Check up of chronic medical conditions     HPI: Eric Trevino is a 25 y.o. male presenting on 10/14/2020 for Hypertension (Check up of chronic medical conditions )  Patient is following up on hypertension and needs a refill of his Lisinopril. He does not check his BP at home. He does eat low salt. He is not exercising. He has been out of his medication for the past week and a half.   New complaints: None   Social history:  Relevant past medical, surgical, family and social history reviewed and updated as indicated. Interim medical history since our last visit reviewed.  Allergies and medications reviewed and updated.  DATA REVIEWED: CHART IN EPIC  ROS: Negative unless specifically indicated above in HPI.    Current Outpatient Medications:    lisinopril (ZESTRIL) 20 MG tablet, Take 1 tablet (20 mg total) by mouth daily. (NEEDS TO BE SEEN BEFORE NEXT REFILL), Disp: 30 tablet, Rfl: 0   No Known Allergies Past Medical History:  Diagnosis Date    Hypertension     History reviewed. No pertinent surgical history.  Social History   Socioeconomic History   Marital status: Single    Spouse name: Not on file   Number of children: Not on file   Years of education: Not on file   Highest education level: Not on file  Occupational History   Not on file  Tobacco Use   Smoking status: Every Day    Packs/day: 1.00    Years: 3.00    Pack years: 3.00    Types: Cigarettes   Smokeless tobacco: Never  Vaping Use   Vaping Use: Never used  Substance and Sexual Activity   Alcohol use: No   Drug use: No   Sexual activity: Yes  Other Topics Concern   Not on file  Social History Narrative   Not on file   Social Determinants of Health   Financial Resource Strain: Not on file  Food Insecurity: Not on file  Transportation Needs: Not on file  Physical Activity: Not on file  Stress: Not on file  Social Connections: Not on file  Intimate Partner Violence: Not on file        Objective:    BP (!) 147/86   Pulse 90   Temp 98.3 F (36.8 C)   Ht 5' 10"  (1.778 m)   Wt 227 lb 12.8 oz (103.3 kg)   SpO2 90%   BMI 32.69 kg/m   Wt Readings from Last 3 Encounters:  10/14/20 227 lb 12.8 oz (103.3 kg)  03/01/20 233 lb 3.2 oz (105.8 kg)  07/07/19 217 lb 3.2 oz (98.5 kg)    Physical Exam Vitals reviewed.  Constitutional:      General: He is not in acute distress.    Appearance: Normal appearance. He is obese. He is not ill-appearing, toxic-appearing or diaphoretic.  HENT:     Head: Normocephalic and atraumatic.  Eyes:     General: No scleral icterus.       Right eye: No discharge.        Left eye: No discharge.     Conjunctiva/sclera: Conjunctivae normal.  Cardiovascular:     Rate and Rhythm: Normal rate and regular rhythm.     Heart sounds: Normal heart sounds. No murmur heard.   No friction rub. No gallop.  Pulmonary:     Effort: Pulmonary effort is normal. No respiratory distress.     Breath sounds: Normal breath sounds.  No stridor. No wheezing, rhonchi or rales.  Musculoskeletal:        General: Normal range of motion.     Cervical back: Normal range of motion.  Skin:    General: Skin is warm and dry.  Neurological:     Mental Status: He is alert and oriented to person, place, and time. Mental status is at baseline.  Psychiatric:        Mood and Affect: Mood normal.        Behavior: Behavior normal.        Thought Content: Thought content normal.        Judgment: Judgment normal.    No results found for: TSH Lab Results  Component Value Date   WBC 7.1 03/05/2017   HGB 15.2 03/05/2017   HCT 45.2 03/05/2017   MCV 86 03/05/2017   PLT 257 03/05/2017   Lab Results  Component Value Date   NA 139 03/19/2017   K 4.3 03/19/2017   CO2 27 03/19/2017   GLUCOSE 81 03/19/2017   BUN 9 03/19/2017   CREATININE 0.88 03/19/2017   BILITOT 0.2 03/05/2017   ALKPHOS 76 03/05/2017   AST 22 03/05/2017   ALT 22 03/05/2017   PROT 7.8 03/05/2017   ALBUMIN 4.8 03/05/2017   CALCIUM 9.7 03/19/2017   No results found for: CHOL No results found for: HDL No results found for: LDLCALC No results found for: TRIG No results found for: CHOLHDL No results found for: HGBA1C

## 2021-01-14 ENCOUNTER — Ambulatory Visit: Payer: Self-pay | Admitting: Family Medicine

## 2021-02-04 ENCOUNTER — Ambulatory Visit: Payer: Self-pay | Admitting: Family Medicine

## 2021-04-28 ENCOUNTER — Other Ambulatory Visit: Payer: Self-pay | Admitting: Family Medicine

## 2021-04-28 DIAGNOSIS — I10 Essential (primary) hypertension: Secondary | ICD-10-CM

## 2022-11-14 ENCOUNTER — Encounter: Payer: Self-pay | Admitting: Nurse Practitioner

## 2022-11-14 ENCOUNTER — Ambulatory Visit: Payer: No Typology Code available for payment source | Admitting: Nurse Practitioner

## 2022-11-14 VITALS — BP 126/72 | HR 88 | Temp 98.2°F | Ht 70.0 in | Wt 256.6 lb

## 2022-11-14 DIAGNOSIS — F17200 Nicotine dependence, unspecified, uncomplicated: Secondary | ICD-10-CM

## 2022-11-14 DIAGNOSIS — I10 Essential (primary) hypertension: Secondary | ICD-10-CM | POA: Diagnosis not present

## 2022-11-14 DIAGNOSIS — K219 Gastro-esophageal reflux disease without esophagitis: Secondary | ICD-10-CM | POA: Diagnosis not present

## 2022-11-14 DIAGNOSIS — Z833 Family history of diabetes mellitus: Secondary | ICD-10-CM

## 2022-11-14 DIAGNOSIS — Z0000 Encounter for general adult medical examination without abnormal findings: Secondary | ICD-10-CM | POA: Insufficient documentation

## 2022-11-14 DIAGNOSIS — Z6836 Body mass index (BMI) 36.0-36.9, adult: Secondary | ICD-10-CM | POA: Insufficient documentation

## 2022-11-14 DIAGNOSIS — Z716 Tobacco abuse counseling: Secondary | ICD-10-CM

## 2022-11-14 LAB — CBC WITH DIFFERENTIAL/PLATELET
Basophils Absolute: 0 10*3/uL (ref 0.0–0.2)
Basos: 0 %
EOS (ABSOLUTE): 0.2 10*3/uL (ref 0.0–0.4)
Eos: 3 %
Hematocrit: 43.5 % (ref 37.5–51.0)
Hemoglobin: 14.6 g/dL (ref 13.0–17.7)
Immature Grans (Abs): 0 10*3/uL (ref 0.0–0.1)
Immature Granulocytes: 0 %
Lymphocytes Absolute: 2.4 10*3/uL (ref 0.7–3.1)
Lymphs: 31 %
MCH: 28.8 pg (ref 26.6–33.0)
MCHC: 33.6 g/dL (ref 31.5–35.7)
MCV: 86 fL (ref 79–97)
Monocytes Absolute: 0.9 10*3/uL (ref 0.1–0.9)
Monocytes: 11 %
Neutrophils Absolute: 4.2 10*3/uL (ref 1.4–7.0)
Neutrophils: 55 %
Platelets: 311 10*3/uL (ref 150–450)
RBC: 5.07 x10E6/uL (ref 4.14–5.80)
RDW: 13.2 % (ref 11.6–15.4)
WBC: 7.8 10*3/uL (ref 3.4–10.8)

## 2022-11-14 LAB — CMP14+EGFR

## 2022-11-14 LAB — LIPID PANEL

## 2022-11-14 LAB — THYROID PANEL WITH TSH

## 2022-11-14 LAB — BAYER DCA HB A1C WAIVED: HB A1C (BAYER DCA - WAIVED): 5.6 % (ref 4.8–5.6)

## 2022-11-14 MED ORDER — OMEPRAZOLE 20 MG PO CPDR: 20.0000 mg | DELAYED_RELEASE_CAPSULE | Freq: Every day | ORAL | 3 refills | Status: AC

## 2022-11-14 MED ORDER — LISINOPRIL 10 MG PO TABS: 10.0000 mg | ORAL_TABLET | Freq: Every day | ORAL | 3 refills | Status: AC

## 2022-11-14 NOTE — Progress Notes (Signed)
New Patient Office Visit  Subjective    Patient ID: Eric Trevino, male    DOB: 25-Jun-1995  Age: 27 y.o. MRN: [id number]  CC:  Chief Complaint  Patient presents with   Establish Care   Hypertension    Has been checking bp at home average 140/90    HPI Eric Trevino presents to re-establish care. With PMH HTN.  He was prescribed Lisinopril and reports that he had stopped taking it because he didn't have insurance an couldn't afford it. " I have been checking BP at home and has around 170/90.  obese He is obese with a BMI of 36.82. reports that he has been losing and trying to improved his diet.  He mentions experiencing symptoms commonly associated with obesity, including shortness of breath on exertion, joint pain, and increased fatigue, which have impacted his ability to perform daily activities.  has a history of hypertension conditions that are commonly linked with obesity. Regarding his lifestyle, the patient follows a diet that includes junk food and fried food and snacks throughout the day. 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 Sleep patterns are somewhat disturbed, and he occasionally experiences difficulty sleeping, which he attributes to discomfort and possible sleep apnea.  Smokes 1/2 pack daily, been smoking since he was 27 yrs old Denies the use of ETOH, Illicit drugs including THC Family history of diabetes Health maintenance is up to date  Active Ambulatory Problems    Diagnosis Date Noted   Essential hypertension 03/01/2020   Chest pain 03/01/2020   Family history of diabetes mellitus in father 11/14/2022   Routine medical exam 11/14/2022   Gastroesophageal reflux disease without esophagitis 11/14/2022   Current smoker 11/14/2022   Encounter for smoking cessation counseling 11/14/2022   BMI 36.0-36.9,adult 11/14/2022   Resolved Ambulatory Problems    Diagnosis Date Noted   No Resolved  Ambulatory Problems   Past Medical History:  Diagnosis Date   Hypertension      Outpatient Encounter Medications as of 11/14/2022  Medication Sig   lisinopril (ZESTRIL) 10 MG tablet Take 1 tablet (10 mg total) by mouth daily.   omeprazole (PRILOSEC) 20 MG capsule Take 1 capsule (20 mg total) by mouth daily.   [DISCONTINUED] lisinopril (ZESTRIL) 20 MG tablet Take 1 tablet by mouth once daily (Patient not taking: Reported on 11/14/2022)   No facility-administered encounter medications on file as of 11/14/2022.    Past Medical History:  Diagnosis Date   Hypertension     History reviewed. No pertinent surgical history.  Family History  Problem Relation Age of Onset   Vision loss Mother    Vision loss Brother     Social History   Socioeconomic History   Marital status: Single    Spouse name: Not on file   Number of children: Not on file   Years of education: Not on file   Highest education level: Not on file  Occupational History   Not on file  Tobacco Use   Smoking status: Every Day    Current packs/day: 1.00    Average packs/day: 1 pack/day for 3.0 years (3.0 ttl pk-yrs)    Types: Cigarettes   Smokeless tobacco: Never  Vaping Use   Vaping status: Never Used  Substance and Sexual Activity   Alcohol use: No   Drug use: No   Sexual activity: Yes  Other Topics Concern   Not on file  Social History  Narrative   Not on file   Social Determinants of Health   Financial Resource Strain: Low Risk  (11/14/2022)   Overall Financial Resource Strain (CARDIA)    Difficulty of Paying Living Expenses: Not hard at all  Food Insecurity: No Food Insecurity (11/14/2022)   Hunger Vital Sign    Worried About Running Out of Food in the Last Year: Never true    Ran Out of Food in the Last Year: Never true  Transportation Needs: No Transportation Needs (11/14/2022)   PRAPARE - Administrator, Civil Service (Medical): No    Lack of Transportation (Non-Medical): No  Physical  Activity: Not on file  Stress: Not on file  Social Connections: Not on file  Intimate Partner Violence: Not At Risk (11/14/2022)   Humiliation, Afraid, Rape, and Kick questionnaire    Fear of Current or Ex-Partner: No    Emotionally Abused: No    Physically Abused: No    Sexually Abused: No    Review of Systems  Constitutional:  Negative for chills and fever.  Respiratory:  Negative for cough and shortness of breath.   Cardiovascular:  Negative for chest pain and leg swelling.  Gastrointestinal:  Negative for blood in stool, melena, nausea and vomiting.  Musculoskeletal:  Positive for joint pain.  Skin:  Negative for itching and rash.  Neurological:  Negative for dizziness and weakness.  Psychiatric/Behavioral:  Negative for substance abuse and suicidal ideas. The patient does not have insomnia.    Negative unless indicated in HPI   Objective    BP 126/72   Pulse 88   Temp 98.2 F (36.8 C) (Temporal)   Ht 5\' 10"  (1.778 m)   Wt 256 lb 9.6 oz (116.4 kg)   SpO2 98%   BMI 36.82 kg/m   Physical Exam Vitals and nursing note reviewed.  Constitutional:      General: He is not in acute distress.    Appearance: Normal appearance. He is normal weight. He is not ill-appearing.  HENT:     Head: Normocephalic and atraumatic.  Eyes:     General: No scleral icterus.    Extraocular Movements: Extraocular movements intact.     Conjunctiva/sclera: Conjunctivae normal.     Pupils: Pupils are equal, round, and reactive to light.  Neck:     Vascular: No carotid bruit.  Cardiovascular:     Rate and Rhythm: Normal rate and regular rhythm.  Pulmonary:     Effort: Pulmonary effort is normal.     Breath sounds: Normal breath sounds.  Abdominal:     General: Bowel sounds are normal. There is distension.     Palpations: Abdomen is soft. There is no mass.     Tenderness: There is no abdominal tenderness. There is no right CVA tenderness, left CVA tenderness, guarding or rebound.      Hernia: No hernia is present.  Musculoskeletal:        General: Normal range of motion.     Cervical back: Normal range of motion and neck supple. No rigidity or tenderness.     Right lower leg: No edema.     Left lower leg: No edema.  Lymphadenopathy:     Cervical: No cervical adenopathy.  Skin:    General: Skin is warm and dry.     Coloration: Skin is not jaundiced.  Neurological:     Mental Status: He is alert and oriented to person, place, and time. Mental status is at baseline.  Psychiatric:  Mood and Affect: Mood normal.        Behavior: Behavior normal.        Thought Content: Thought content normal.        Judgment: Judgment normal.    Last CBC Lab Results  Component Value Date   WBC 7.1 03/05/2017   HGB 15.2 03/05/2017   HCT 45.2 03/05/2017   MCV 86 03/05/2017   MCH 29.1 03/05/2017   RDW 13.8 03/05/2017   PLT 257 03/05/2017   Last metabolic panel Lab Results  Component Value Date   GLUCOSE 81 03/19/2017   NA 139 03/19/2017   K 4.3 03/19/2017   CL 100 03/19/2017   CO2 27 03/19/2017   BUN 9 03/19/2017   CREATININE 0.88 03/19/2017   GFRNONAA 125 03/19/2017   CALCIUM 9.7 03/19/2017   PROT 7.8 03/05/2017   ALBUMIN 4.8 03/05/2017   LABGLOB 3.0 03/05/2017   AGRATIO 1.6 03/05/2017   BILITOT 0.2 03/05/2017   ALKPHOS 76 03/05/2017   AST 22 03/05/2017   ALT 22 03/05/2017     Assessment & Plan:  Routine medical exam -     CBC with Differential/Platelet -     CMP14+EGFR -     Lipid panel -     Thyroid Panel With TSH -     Bayer DCA Hb A1c Waived  Essential hypertension -     CMP14+EGFR -     Lisinopril; Take 1 tablet (10 mg total) by mouth daily.  Dispense: 90 tablet; Refill: 3  Current smoker  Gastroesophageal reflux disease without esophagitis -     CBC with Differential/Platelet -     Omeprazole; Take 1 capsule (20 mg total) by mouth daily.  Dispense: 30 capsule; Refill: 3  Family history of diabetes mellitus in father -     Bayer DCA Hb  A1c Waived  Encounter for smoking cessation counseling  BMI 36.0-36.9,adult  Canek was seen today for chronic disease management, no acute distress HTN: restarted Lisinopril 10 mg daily BP log sheet provided to client, he is instructed to check his BP BID and to bring the log back at his next f/u. Obese: he is not interested in weight lost med Increase physical activity - better food choices and portion control Labs: CBC, CMP, Lipid, TSH, A1c    healthy lifestyle choices, including diet (rich in fruits, vegetables, and lean proteins, and low in salt and simple carbohydrates) and exercise (at least 30 minutes of moderate physical activity daily).     The above assessment and management plan was discussed with the patient. The patient verbalized understanding of and has agreed to the management plan. Patient is aware to call the clinic if they develop any new symptoms or if symptoms persist or worsen. Patient is aware when to return to the clinic for a follow-up visit. Patient educated on when it is appropriate to go to the emergency department.   Return in about 6 weeks (around 12/26/2022).   Arrie Aran Santa Lighter, DNP [facility] [address] [place], [place] [address] [phone]

## 2022-11-15 ENCOUNTER — Other Ambulatory Visit: Payer: Self-pay | Admitting: Nurse Practitioner

## 2022-11-15 DIAGNOSIS — E782 Mixed hyperlipidemia: Secondary | ICD-10-CM

## 2022-11-15 MED ORDER — ATORVASTATIN CALCIUM 10 MG PO TABS: 10.0000 mg | ORAL_TABLET | Freq: Every day | ORAL | 0 refills | Status: AC

## 2022-11-15 NOTE — Progress Notes (Signed)
Kidney, liver, thyroid function are within normal ranges  Lipid Panel        Component                Value               Date/Time                 CHOL                     214 (H)             11/14/2022 0818           TRIG                     174 (H)             11/14/2022 0818           HDL                      29 (L)              11/14/2022 0818           CHOLHDL                  7.4 (H)             11/14/2022 0818           LDLCALC                  153 (H)             11/14/2022 0818           LABVLDL                  32                  11/14/2022 0818        Diet encouraged - increase intake of fresh fruits and vegetables, increase intake of lean proteins. Bake, broil, or grill foods. Avoid fried, greasy, and fatty foods. Avoid fast foods. Increase intake of fiber-rich whole grains. Exercise encouraged - at least 150 minutes per week and advance as tolerated.  Goal BMI < 25. Continue medications as prescribed. Follow up in 3-6 months as discussed.   Lipitor 10 mg dialy will be sent to your pharmacy

## 2022-11-15 NOTE — Progress Notes (Signed)
Patient r/c  

## 2022-11-26 ENCOUNTER — Other Ambulatory Visit: Payer: Self-pay

## 2022-11-26 ENCOUNTER — Emergency Department (HOSPITAL_COMMUNITY)
Admission: EM | Admit: 2022-11-26 | Discharge: 2022-11-26 | Disposition: A | Discharge status: Home / Self Care | Payer: No Typology Code available for payment source | Attending: Emergency Medicine | Admitting: Emergency Medicine

## 2022-11-26 ENCOUNTER — Emergency Department (HOSPITAL_COMMUNITY): Payer: No Typology Code available for payment source

## 2022-11-26 ENCOUNTER — Encounter (HOSPITAL_COMMUNITY): Payer: Self-pay | Admitting: Emergency Medicine

## 2022-11-26 DIAGNOSIS — J45909 Unspecified asthma, uncomplicated: Secondary | ICD-10-CM | POA: Insufficient documentation

## 2022-11-26 DIAGNOSIS — X500XXA Overexertion from strenuous movement or load, initial encounter: Secondary | ICD-10-CM | POA: Diagnosis not present

## 2022-11-26 DIAGNOSIS — Z79899 Other long term (current) drug therapy: Secondary | ICD-10-CM | POA: Insufficient documentation

## 2022-11-26 DIAGNOSIS — I10 Essential (primary) hypertension: Secondary | ICD-10-CM | POA: Insufficient documentation

## 2022-11-26 DIAGNOSIS — M25571 Pain in right ankle and joints of right foot: Secondary | ICD-10-CM | POA: Diagnosis present

## 2022-11-26 DIAGNOSIS — Y9344 Activity, trampolining: Secondary | ICD-10-CM | POA: Diagnosis not present

## 2022-11-26 DIAGNOSIS — S82891A Other fracture of right lower leg, initial encounter for closed fracture: Secondary | ICD-10-CM

## 2022-11-26 MED ORDER — IBUPROFEN 800 MG PO TABS
800.0000 mg | ORAL_TABLET | Freq: Once | ORAL | Status: AC
  Administered 2022-11-26: 800 mg via ORAL
  Filled 2022-11-26: qty 1

## 2022-11-26 MED ORDER — IBUPROFEN 600 MG PO TABS: 600.0000 mg | ORAL_TABLET | Freq: Four times a day (QID) | ORAL | 0 refills | Status: AC | PRN

## 2022-11-26 NOTE — ED Provider Notes (Signed)
[place] EMERGENCY DEPARTMENT AT [facility] Provider Note   CSN: [id number] Arrival date & time: 11/26/22  1535     History  Chief Complaint  Patient presents with   Ankle Pain    Eric Trevino is a 27 y.o. male with pmhx of [place] is presenting with ankle and foot pain after jumping on the trampoline earlier today.  Patient reports he had an inversion type injury rolled his ankle and immediately heard a pop and had pain.  He has not been able to weight-bear or walk since the injury.  He has not take anything for pain.  Denies any other injury not reporting any knee or hip pain.    Ankle Pain      Home Medications Prior to Admission medications   Medication Sig Start Date End Date Taking? Authorizing Provider  atorvastatin (LIPITOR) 10 MG tablet Take 1 tablet (10 mg total) by mouth daily. 11/15/22 11/15/23  St Vena Austria, NP  lisinopril (ZESTRIL) 10 MG tablet Take 1 tablet (10 mg total) by mouth daily. 11/14/22   St Vena Austria, NP  omeprazole (PRILOSEC) 20 MG capsule Take 1 capsule (20 mg total) by mouth daily. 11/14/22   St Vena Austria, NP      Allergies    Patient has no known allergies.    Review of Systems   Review of Systems  Musculoskeletal:        Ankle pain     Physical Exam Updated Vital Signs BP 133/80 (BP Location: Left Arm)   Pulse 100   Temp 98.6 F (37 C) (Oral)   Resp 17   Ht 6' (1.829 m)   Wt 114.3 kg   SpO2 100%   BMI 34.18 kg/m  Physical Exam Vitals and nursing note reviewed.  Constitutional:      General: He is not in acute distress.    Appearance: He is not toxic-appearing.  HENT:     Head: Normocephalic and atraumatic.  Eyes:     General: No scleral icterus.    Conjunctiva/sclera: Conjunctivae normal.  Cardiovascular:     Rate and Rhythm: Normal rate and regular rhythm.     Pulses: Normal pulses.     Heart sounds: Normal heart sounds.  Pulmonary:     Effort: Pulmonary effort is  normal. No respiratory distress.     Breath sounds: Normal breath sounds.  Abdominal:     General: Abdomen is flat. Bowel sounds are normal.     Palpations: Abdomen is soft.     Tenderness: There is no abdominal tenderness.  Skin:    General: Skin is warm and dry.     Findings: No lesion.  Neurological:     General: No focal deficit present.     Mental Status: He is alert and oriented to person, place, and time. Mental status is at baseline.     ED Results / Procedures / Treatments   Labs (all labs ordered are listed, but only abnormal results are displayed) Labs Reviewed - No data to display  EKG None  Radiology No results found.  Procedures Procedures    Medications Ordered in ED Medications - No data to display  ED Course/ Medical Decision Making/ A&P                                 Medical Decision Making Amount and/or Complexity of Data Reviewed Radiology: ordered.  Risk Prescription drug management.   This patient presents to the ED for concern of right ankle and foot apin, this involves an extensive number of treatment options, and is a complaint that carries with it a high risk of complications and morbidity.  The differential diagnosis includes sprain, fracture, contusion, cellulitis, gout, DVT   Co morbidities that complicate the patient evaluation  HTN    Additional history obtained:  Additional history obtained from family member at bedside    Lab Tests:  None    Imaging Studies ordered:  I ordered imaging studies including xray right foot and ankle   I independently visualized and interpreted imaging which showed distal fibular head fracture  I agree with the radiologist interpretation   Cardiac Monitoring: / EKG:  Vitals stable upon arrival    Consultations Obtained:  None    Problem List / ED Course / Critical interventions / Medication management  Pt presented in ED after inversion right ankle injury - now reporting  NWB.  I ordered medication including ibuprofen  for ankle pain   Reevaluation of the patient after these medicines showed that the patient improved I have reviewed the patients home medicines and have made adjustments as needed   Plan Patient was given a cam boot as well as crutches.  And encouraged to follow-up with orthopedics next week.  He will call and agrees to plan. Alternate Tylenol ibuprofen and ice for pain control. Okay to return to work as long as work can accommodate for current ankle fracture.  Follow-up with orthopedics to get further restrictions on return to work and follow up plan And to the ER if new or worsening symptoms        Final Clinical Impression(s) / ED Diagnoses Final diagnoses:  None    Rx / DC Orders ED Discharge Orders     None         Raford Pitcher, Evalee Jefferson 11/26/22 2132    Eber Hong, MD 11/27/22 1153

## 2022-11-26 NOTE — Discharge Instructions (Addendum)
You were seen in the emergency room today for right-sided ankle pain.  You are diagnosed with a fracture of your fibula.   Call orthopedics to set up an appointment for ankle fracture. please use the walking boot and crutches until you are seen at orthopedics.  You may weight-bear as tolerated.  Alternate Tylenol and ibuprofen for pain control.  You can use ice as well.  Remember to use the Ace bandage and elevate your ankle for the swelling.  Please return to the ER if your symptoms continue or worsen.

## 2022-11-26 NOTE — ED Triage Notes (Signed)
Pt c/o right ankle pain after jumping at trampoline park earlier. Pt states he cannot bear weight.

## 2022-11-29 ENCOUNTER — Encounter: Payer: Self-pay | Admitting: Orthopedic Surgery

## 2022-11-29 ENCOUNTER — Ambulatory Visit: Payer: No Typology Code available for payment source | Admitting: Orthopedic Surgery

## 2022-11-29 VITALS — BP 133/80 | Ht 72.0 in | Wt 252.0 lb

## 2022-11-29 DIAGNOSIS — S82839A Other fracture of upper and lower end of unspecified fibula, initial encounter for closed fracture: Secondary | ICD-10-CM | POA: Diagnosis not present

## 2022-11-29 NOTE — Patient Instructions (Addendum)
Ok to get rid of crutches when you are ready Also start soaking in epsom salts daily  Out of work note needed until 12/11/22

## 2022-11-29 NOTE — Progress Notes (Signed)
Office Visit Note   Patient: Eric Trevino           Date of Birth: 1995/08/21           MRN: [id number] Visit Date: 11/29/2022 Requested by: Martina Sinner, NP [address] [place],  [place] [address] PCP: Martina Sinner, NP   Assessment & Plan:   Encounter Diagnosis  Name Primary?   Avulsion fracture of distal fibula Yes    No orders of the defined types were placed in this encounter.   Patient will see me in a week he can return to work probably on the ninth  He has a nondisplaced avulsion fracture at the tip of the fibula  Weight-bear as tolerated Epsom salt soaks crutches as needed recheck next week to see if he can go back to work on the ninth   Subjective: Chief Complaint  Patient presents with   Ankle Injury    Right 11/26/22     HPI: This is a 27 year old male who works for the [employer] of [place] injured his right ankle jumping on a trampoline at his son's birthday.  To avoid his son he moved awkwardly on the trampoline and fractured the tip of his fibula.  He is in a cam walker he is on crutches he is weightbearing he has some swelling  "I am ready to go back to work"              ROS: No chest pain no shortness of breath no vision disturbances no easy bruising or bleeding no urinary complaints no numbness or tingling   Images personally read and my interpretation : Outside images were interpreted.  I interpreted these images to indicate an avulsion fracture at the tip of the fibula most likely involving the anterior talofibular ligament or calcaneofibular ligament.  Visit Diagnoses:  1. Avulsion fracture of distal fibula      Follow-Up Instructions: Return in 1 week (on 12/06/2022) for Right ankle , FOLLOW UP, RIGHT, ANKLE.    Objective: Vital Signs: BP 133/80 Comment: 11/26/22  Ht 6' (1.829 m)   Wt 252 lb (114.3 kg)   BMI 34.18 kg/m   Physical Exam Vitals and nursing note reviewed.  Constitutional:      Appearance: Normal  appearance.  HENT:     Head: Normocephalic and atraumatic.  Eyes:     General: No scleral icterus.       Right eye: No discharge.        Left eye: No discharge.     Extraocular Movements: Extraocular movements intact.     Conjunctiva/sclera: Conjunctivae normal.     Pupils: Pupils are equal, round, and reactive to light.  Cardiovascular:     Rate and Rhythm: Normal rate.     Pulses: Normal pulses.  Skin:    General: Skin is warm and dry.     Capillary Refill: Capillary refill takes less than 2 seconds.  Neurological:     General: No focal deficit present.     Mental Status: He is alert and oriented to person, place, and time.  Psychiatric:        Mood and Affect: Mood normal.        Behavior: Behavior normal.        Thought Content: Thought content normal.        Judgment: Judgment normal.      Right Ankle Exam    Comments:  Right ankle has some tenderness and swelling  laterally.  Just a little tenderness medially.  The inversion test was stable to anterior drawer had a 1+ laxity with a firm endpoint.  Skin along the ankle and foot normal Achilles tendon intact no tenderness.  Knee normal exam      Specialty Comments:  No specialty comments available.  Imaging: No results found.   PMFS History: Patient Active Problem List   Diagnosis Date Noted   Mixed hyperlipidemia 11/15/2022   Family history of diabetes mellitus in father 11/14/2022   Routine medical exam 11/14/2022   Gastroesophageal reflux disease without esophagitis 11/14/2022   Current smoker 11/14/2022   Encounter for smoking cessation counseling 11/14/2022   BMI 36.0-36.9,adult 11/14/2022   Essential hypertension 03/01/2020   Chest pain 03/01/2020   Past Medical History:  Diagnosis Date   Hypertension     Family History  Problem Relation Age of Onset   Vision loss Mother    Vision loss Brother     History reviewed. No pertinent surgical history. Social History   Occupational History   Not  on file  Tobacco Use   Smoking status: Every Day    Current packs/day: 1.00    Average packs/day: 1 pack/day for 3.0 years (3.0 ttl pk-yrs)    Types: Cigarettes   Smokeless tobacco: Never  Vaping Use   Vaping status: Never Used  Substance and Sexual Activity   Alcohol use: No   Drug use: No   Sexual activity: Yes

## 2022-12-05 DIAGNOSIS — S82839A Other fracture of upper and lower end of unspecified fibula, initial encounter for closed fracture: Secondary | ICD-10-CM | POA: Insufficient documentation

## 2022-12-06 ENCOUNTER — Encounter: Payer: Self-pay | Admitting: Orthopedic Surgery

## 2022-12-06 ENCOUNTER — Ambulatory Visit: Payer: No Typology Code available for payment source | Admitting: Orthopedic Surgery

## 2022-12-06 VITALS — BP 133/80 | Ht 72.0 in | Wt 252.0 lb

## 2022-12-06 DIAGNOSIS — S82839A Other fracture of upper and lower end of unspecified fibula, initial encounter for closed fracture: Secondary | ICD-10-CM | POA: Diagnosis not present

## 2022-12-06 NOTE — Progress Notes (Signed)
   VISIT TYPE: FOLLOW UP   Chief Complaint  Patient presents with   Ankle Injury    Improving     Encounter Diagnosis  Name Primary?   Avulsion fracture of distal fibula 11/26/22 right Yes    Assessment and Plan: 27 year old male avulsion fracture of the fibula right ankle doing well May return to work   Prior treatment:  [facility] treatment   HPI: 27 year old male sustained an avulsion fracture of the right distal fibula on 11/26/2022 presented for evaluation and management  He has improved significantly with normal range of motion mild swelling and he is eager to go back to work    BP 133/80 Comment: 11/26/22  Ht 6' (1.829 m)   Wt 252 lb (114.3 kg)   BMI 34.18 kg/m   Ortho Exam Full range of motion trace positive anterior drawer with firm endpoint there is some bruising around the bottom of the heel Achilles tendon intact  Imaging previous x-rays show an avulsion fracture  A/P Encounter Diagnosis  Name Primary?   Avulsion fracture of distal fibula 11/26/22 right Yes    No orders of the defined types were placed in this encounter.

## 2022-12-26 ENCOUNTER — Ambulatory Visit: Payer: 59 | Admitting: Nurse Practitioner

## 2022-12-27 ENCOUNTER — Encounter: Payer: Self-pay | Admitting: Nurse Practitioner
